# Patient Record
Sex: Female | Born: 1980 | Race: Black or African American | Hispanic: No | Marital: Married | Smoking: Never smoker
Health system: Southern US, Community
[De-identification: ages and names within clinical notes are randomized; demographics above are authoritative.]

## PROBLEM LIST (undated history)

## (undated) ENCOUNTER — Inpatient Hospital Stay (HOSPITAL_COMMUNITY): Payer: Self-pay

## (undated) DIAGNOSIS — K829 Disease of gallbladder, unspecified: Secondary | ICD-10-CM

## (undated) DIAGNOSIS — K219 Gastro-esophageal reflux disease without esophagitis: Secondary | ICD-10-CM

## (undated) DIAGNOSIS — R87619 Unspecified abnormal cytological findings in specimens from cervix uteri: Secondary | ICD-10-CM

## (undated) DIAGNOSIS — R109 Unspecified abdominal pain: Secondary | ICD-10-CM

## (undated) DIAGNOSIS — IMO0002 Reserved for concepts with insufficient information to code with codable children: Secondary | ICD-10-CM

## (undated) DIAGNOSIS — R0602 Shortness of breath: Secondary | ICD-10-CM

## (undated) DIAGNOSIS — D649 Anemia, unspecified: Secondary | ICD-10-CM

## (undated) DIAGNOSIS — E559 Vitamin D deficiency, unspecified: Secondary | ICD-10-CM

## (undated) DIAGNOSIS — K5900 Constipation, unspecified: Secondary | ICD-10-CM

## (undated) DIAGNOSIS — R079 Chest pain, unspecified: Secondary | ICD-10-CM

## (undated) DIAGNOSIS — F32A Depression, unspecified: Secondary | ICD-10-CM

## (undated) DIAGNOSIS — M549 Dorsalgia, unspecified: Secondary | ICD-10-CM

## (undated) HISTORY — DX: Disease of gallbladder, unspecified: K82.9

## (undated) HISTORY — DX: Dorsalgia, unspecified: M54.9

## (undated) HISTORY — DX: Unspecified abnormal cytological findings in specimens from cervix uteri: R87.619

## (undated) HISTORY — DX: Depression, unspecified: F32.A

## (undated) HISTORY — DX: Chest pain, unspecified: R07.9

## (undated) HISTORY — PX: LEEP: SHX91

## (undated) HISTORY — DX: Gastro-esophageal reflux disease without esophagitis: K21.9

## (undated) HISTORY — DX: Reserved for concepts with insufficient information to code with codable children: IMO0002

## (undated) HISTORY — DX: Shortness of breath: R06.02

## (undated) HISTORY — DX: Unspecified abdominal pain: R10.9

## (undated) HISTORY — PX: BREAST BIOPSY: SHX20

## (undated) HISTORY — DX: Constipation, unspecified: K59.00

## (undated) HISTORY — PX: WISDOM TOOTH EXTRACTION: SHX21

## (undated) HISTORY — DX: Anemia, unspecified: D64.9

## (undated) HISTORY — DX: Vitamin D deficiency, unspecified: E55.9

---

## 2002-09-07 ENCOUNTER — Emergency Department (HOSPITAL_COMMUNITY): Admission: EM | Admit: 2002-09-07 | Discharge: 2002-09-07 | Payer: Self-pay | Admitting: Emergency Medicine

## 2002-09-07 ENCOUNTER — Encounter: Payer: Self-pay | Admitting: Emergency Medicine

## 2003-03-31 ENCOUNTER — Inpatient Hospital Stay (HOSPITAL_COMMUNITY): Admission: AD | Admit: 2003-03-31 | Discharge: 2003-03-31 | Payer: Self-pay | Admitting: *Deleted

## 2004-10-28 ENCOUNTER — Other Ambulatory Visit: Admission: RE | Admit: 2004-10-28 | Discharge: 2004-10-28 | Payer: Self-pay | Admitting: Gynecology

## 2005-01-26 ENCOUNTER — Encounter (INDEPENDENT_AMBULATORY_CARE_PROVIDER_SITE_OTHER): Payer: Self-pay | Admitting: Specialist

## 2005-01-26 ENCOUNTER — Ambulatory Visit: Admission: RE | Admit: 2005-01-26 | Discharge: 2005-01-26 | Payer: Self-pay | Admitting: Gynecologic Oncology

## 2005-07-01 ENCOUNTER — Other Ambulatory Visit: Admission: RE | Admit: 2005-07-01 | Discharge: 2005-07-01 | Payer: Self-pay | Admitting: Gynecology

## 2005-12-29 ENCOUNTER — Inpatient Hospital Stay (HOSPITAL_COMMUNITY): Admission: AD | Admit: 2005-12-29 | Discharge: 2005-12-29 | Payer: Self-pay | Admitting: Obstetrics and Gynecology
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
## 2006-02-08 ENCOUNTER — Inpatient Hospital Stay (HOSPITAL_COMMUNITY): Admission: AD | Admit: 2006-02-08 | Discharge: 2006-02-08 | Payer: Self-pay | Admitting: Obstetrics and Gynecology

## 2006-03-05 ENCOUNTER — Inpatient Hospital Stay (HOSPITAL_COMMUNITY): Admission: AD | Admit: 2006-03-05 | Discharge: 2006-03-07 | Payer: Self-pay | Admitting: Obstetrics and Gynecology

## 2006-03-08 ENCOUNTER — Encounter: Admission: RE | Admit: 2006-03-08 | Discharge: 2006-04-06 | Payer: Self-pay | Admitting: Obstetrics and Gynecology

## 2006-04-07 ENCOUNTER — Encounter: Admission: RE | Admit: 2006-04-07 | Discharge: 2006-05-07 | Payer: Self-pay | Admitting: Obstetrics and Gynecology

## 2006-04-19 ENCOUNTER — Other Ambulatory Visit: Admission: RE | Admit: 2006-04-19 | Discharge: 2006-04-19 | Payer: Self-pay | Admitting: Obstetrics and Gynecology

## 2006-05-08 ENCOUNTER — Encounter: Admission: RE | Admit: 2006-05-08 | Discharge: 2006-06-07 | Payer: Self-pay | Admitting: Obstetrics and Gynecology

## 2006-06-08 ENCOUNTER — Encounter: Admission: RE | Admit: 2006-06-08 | Discharge: 2006-07-07 | Payer: Self-pay | Admitting: Obstetrics and Gynecology

## 2006-07-08 ENCOUNTER — Encounter: Admission: RE | Admit: 2006-07-08 | Discharge: 2006-08-07 | Payer: Self-pay | Admitting: Obstetrics and Gynecology

## 2006-08-08 ENCOUNTER — Encounter: Admission: RE | Admit: 2006-08-08 | Discharge: 2006-09-06 | Payer: Self-pay | Admitting: Obstetrics and Gynecology

## 2006-09-07 ENCOUNTER — Encounter: Admission: RE | Admit: 2006-09-07 | Discharge: 2006-10-07 | Payer: Self-pay | Admitting: Obstetrics and Gynecology

## 2006-10-08 ENCOUNTER — Encounter: Admission: RE | Admit: 2006-10-08 | Discharge: 2006-11-07 | Payer: Self-pay | Admitting: Obstetrics and Gynecology

## 2006-10-25 ENCOUNTER — Emergency Department (HOSPITAL_COMMUNITY): Admission: EM | Admit: 2006-10-25 | Discharge: 2006-10-25 | Payer: Self-pay | Admitting: Emergency Medicine
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
## 2006-11-08 ENCOUNTER — Encounter: Admission: RE | Admit: 2006-11-08 | Discharge: 2006-12-06 | Payer: Self-pay | Admitting: Obstetrics and Gynecology

## 2006-12-07 ENCOUNTER — Encounter: Admission: RE | Admit: 2006-12-07 | Discharge: 2007-01-06 | Payer: Self-pay | Admitting: Obstetrics and Gynecology

## 2007-01-07 ENCOUNTER — Encounter: Admission: RE | Admit: 2007-01-07 | Discharge: 2007-02-05 | Payer: Self-pay | Admitting: Obstetrics and Gynecology

## 2007-02-06 ENCOUNTER — Encounter: Admission: RE | Admit: 2007-02-06 | Discharge: 2007-03-08 | Payer: Self-pay | Admitting: Obstetrics and Gynecology

## 2007-03-09 ENCOUNTER — Encounter: Admission: RE | Admit: 2007-03-09 | Discharge: 2007-04-07 | Payer: Self-pay | Admitting: Obstetrics and Gynecology

## 2007-04-08 ENCOUNTER — Encounter: Admission: RE | Admit: 2007-04-08 | Discharge: 2007-05-08 | Payer: Self-pay | Admitting: Obstetrics and Gynecology

## 2007-05-09 ENCOUNTER — Encounter: Admission: RE | Admit: 2007-05-09 | Discharge: 2007-05-31 | Payer: Self-pay | Admitting: Obstetrics and Gynecology

## 2008-01-26 ENCOUNTER — Encounter: Admission: RE | Admit: 2008-01-26 | Discharge: 2008-01-26 | Payer: Self-pay | Admitting: Obstetrics and Gynecology

## 2008-02-07 ENCOUNTER — Encounter: Admission: RE | Admit: 2008-02-07 | Discharge: 2008-02-07 | Payer: Self-pay | Admitting: Obstetrics and Gynecology

## 2008-03-26 ENCOUNTER — Encounter (INDEPENDENT_AMBULATORY_CARE_PROVIDER_SITE_OTHER): Payer: Self-pay | Admitting: Diagnostic Radiology

## 2008-03-26 ENCOUNTER — Encounter: Admission: RE | Admit: 2008-03-26 | Discharge: 2008-03-26 | Payer: Self-pay | Admitting: General Surgery

## 2008-04-30 ENCOUNTER — Encounter: Admission: RE | Admit: 2008-04-30 | Discharge: 2008-04-30 | Payer: Self-pay | Admitting: General Surgery

## 2009-02-19 ENCOUNTER — Encounter: Admission: RE | Admit: 2009-02-19 | Discharge: 2009-02-19 | Payer: Self-pay | Admitting: Obstetrics and Gynecology

## 2011-02-19 NOTE — Consult Note (Signed)
NAME:  [name] [name], [name] [name]              ACCOUNT NO.:  [id number]   MEDICAL RECORD NO.:  [id number]          PATIENT TYPE:  OUT   LOCATION:  GYN                          FACILITY:  [facility]   PHYSICIAN:  Paola A. Duard Brady, MD    DATE OF BIRTH:  08-Oct-1980   DATE OF CONSULTATION:  01/26/2005  DATE OF DISCHARGE:                                   CONSULTATION   REQUESTING PHYSICIAN:  Leatha Gilding. Mezer, M.D.   Ms. [name] [name] is a 30 year old gravida 0 who is referred to [place] secondary to  abnormal Pap smears.  She underwent a LEEP in [place] in June, 2004, which  revealed low-grade dysplasia with negative margins.  She has not had any Pap  smears since that time, and she states that she did not want to know if  there was anything going on.  She was subsequently seen by Dr. Chevis Pretty in  January, 2006, at which time, Pap smear revealed atypical glandular cells  with atypical endocervical cells.  HPV typing was positive.  She  subsequently had difficulties with GYN exam and went to the operating room  on December 10, 2004 for an exam under anesthesia, cervical biopsies, and ECC.  Cervical biopsy at 6 o'clock revealed cervicitis and squamous metaplasia.  The 12 o'clock biopsy revealed low-grade dysplasias.  ECC was negative.  She  comes in today for followup with [place].   Ms. [name] [name] is overall doing quite well.  She is very anxious and nervous  about GYN examinations, and it is difficult for her to tolerate them from a  psychological standpoint.  She denies any irregular bleeding.  She is  sexually active.  She denies any dyspareunia.   PAST MEDICAL HISTORY:  None.   PAST SURGICAL HISTORY:  Right axillary cyst removal.  LEEP.   ALLERGIES:  None.   MEDICATIONS:  NuvaRing, currently using condoms.   SOCIAL HISTORY:  She is a IT consultant for rehab in counseling.  She is  single.  She denies the use of tobacco.  She drinks alcohol occasionally.  She is sexually active.   FAMILY HISTORY:  She has two aunts  with breast cancer.  One a maternal aunt  and one on the paternal side.  She has one maternal aunt who is alive with  breast cancer.  There is no colon or ovarian cancer.   PHYSICAL EXAMINATION:  VITAL SIGNS:  Height 5 foot 2.  Weight 165 pounds.  Blood pressure 118/66.  Pulse 84, respirations 18.  GENERAL:  Well-developed, well-nourished, pleasant female in no acute  distress.  PELVIC:  External genitalia is within normal limits.  The vagina is well  epithelialized.  The cervix is significantly foreshortened.  The exam is  poorly tolerated by the patient, in that she is not able to cooperate by  lying still.  She denies any pain associated with the examination.  There  are no gross visible lesions.  Colposcopy was performed.  It is adequate  with the use of a Q-tip when the patient is able to cooperate.  ThinPrep Pap  was submitted without difficulty.  Bimanual  examination reveals no cervical  motion tenderness.  The cervix is significantly foreshortened, measuring  approximately 1.5 cm to 2 cm.  The corpus is of normal size, shape, and  consistency.  There are no adnexal masses.   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   PLAN:  Follow up on the results of her Pap smear from today.  Should today's  Pap smear reveal more than low-grade dysplasia, she can return to see [place] in  four months for repeat.      PAG/MEDQ  D:  01/26/2005  T:  01/26/2005  Job:  [id number]   cc:   Leatha Gilding. Mezer, M.D.  [address]  [place]  [place] [address]  Fax: [phone]   Telford Nab, R.N.  [phone] [address]  [place], [place] [address]

## 2011-02-19 NOTE — Discharge Summary (Signed)
NAME:  [name] [name], [name] [name]              ACCOUNT NO.:  [id number]   MEDICAL RECORD NO.:  [id number]          PATIENT TYPE:  INP   LOCATION:  9105                          [facility]ACILITY:  [facility]   PHYSICIAN:  Hal Morales, M.D.DATE OF BIRTH:  [birth year]   DATE OF ADMISSION:  03/05/2006  DATE OF DISCHARGE:  03/07/2006                                 DISCHARGE SUMMARY   ADMITTING DIAGNOSIS:  1.  Intrauterine pregnancy at 40-5/7 weeks.  2.  Early labor.  3.  Positive group B Streptococcus.   PROCEDURE:  1.  Vacuum-assisted vaginal delivery.  2.  Repair of first-degree perineal laceration.   DISCHARGE DIAGNOSES:  1.  Intrauterine pregnancy at term, delivered.  2.  Vacuum-assisted vaginal delivery.  3.  Repair of first-degree perineal laceration.  4.  Positive group B Streptococcus.   [name] is a 30 year old, gravida 1, para 0, who presented at term in  early labor.  Her labor progressed normally.  However, she began to have  fetal heart rate decelerations. Fetal heart rate recovered to baseline with  maneuvers, and labor did continue with pushing.  Fetal heart rate dropped to  60 beats per minute, and vacuum was applied with consent by Dr. Dois Davenport  Rivard.  See dictated note. The patient had a vacuum-assisted vaginal  delivery and gave birth to a 6 pound 6 ounce female infant named Lenell Antu with Apgar scores of 7 at 1 minute and 9 at 5 minutes. Infant and  patient have done well in the postpartum period.  Baby is breast feeding and  ready for discharge on this, the second postpartum day. The patient's vital  signs have remained stable.  She is afebrile.  Her hemoglobin on the first  postpartum day was 11.2, and on this, her second postpartum day, she is  judged to be in satisfactory condition for discharge.  Discharge  instructions per [facility]  handout.   DISCHARGE MEDICATIONS:  1.  Motrin 600 mg p.o. q.6 h p.r.n. pain.  2.  Tylox 1 to 2 p.o. q. 3-4 h  p.r.n. pain.  3.  Prenatal vitamins.   Discharge follow-up will be at the office of [facility] in  6 weeks.      Rica Koyanagi, C.N.M.      Hal Morales, M.D.  Electronically Signed    SDM/MEDQ  D:  03/07/2006  T:  03/07/2006  Job:  [id number]
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
## 2012-07-12 DIAGNOSIS — Z1371 Encounter for nonprocreative screening for genetic disease carrier status: Secondary | ICD-10-CM

## 2012-07-12 DIAGNOSIS — Z803 Family history of malignant neoplasm of breast: Secondary | ICD-10-CM | POA: Insufficient documentation

## 2012-07-12 DIAGNOSIS — IMO0002 Reserved for concepts with insufficient information to code with codable children: Secondary | ICD-10-CM | POA: Insufficient documentation

## 2012-07-18 ENCOUNTER — Encounter: Payer: Self-pay | Admitting: Obstetrics and Gynecology

## 2012-07-18 ENCOUNTER — Ambulatory Visit (INDEPENDENT_AMBULATORY_CARE_PROVIDER_SITE_OTHER): Payer: BC Managed Care – PPO | Admitting: Obstetrics and Gynecology

## 2012-07-18 VITALS — BP 110/68 | Wt 176.6 lb

## 2012-07-18 DIAGNOSIS — Z309 Encounter for contraceptive management, unspecified: Secondary | ICD-10-CM

## 2012-07-18 MED ORDER — NORETHINDRONE ACET-ETHINYL EST 1-20 MG-MCG PO TABS: 1.0000 | ORAL_TABLET | Freq: Every day | ORAL | Status: DC

## 2012-07-18 NOTE — Progress Notes (Signed)
Subjective:    [name] [name] is a 31 y.o. female, G1P1, who presents for to discuss b/c .Loestrin 24: was happy until was changed to Minestrin. Cycle has changed with heavier flow. Feels moodier and tired.  The following portions of the patient's history were reviewed and updated as appropriate: allergies, current medications, past family history.  Review of Systems Pertinent items are noted in HPI. Breast:Negative for breast lump,nipple discharge or nipple retraction Gastrointestinal: Negative for abdominal pain, change in bowel habits or rectal bleeding Urinary:negative   Objective:    BP 110/68  Wt 176 lb 9.6 oz (80.105 kg)    Weight:  Wt Readings from Last 1 Encounters:  07/18/12 176 lb 9.6 oz (80.105 kg)          BMI: There is no height on file to calculate BMI.  General Appearance: Alert, appropriate appearance for age. No acute distress    Assessment:    Contraceptive management    Plan:   The following methods were reviewed:  Combined oral contraceptives  yes    With expected benefits of: cycle control, reduction in menstrual flow and dysmenorrhea, improvement of PMS and reduction of ovarian cysts. Risks of DVT/PE discussed.   Nuvaring yes With expected benefits of: cycle control, reduction in menstrual flow and dysmenorrhea, improvement of PMS and reduction of ovarian cysts. Risks of DVT/PE discussed.Benefit of monthly use as opposed to daily use was also reviewed.   Mirena yes With expected benefits of: lack of estrogen,5 year duration, high reliability at 99.9%, reversibility, reduction or cessation of menstrual flow and improvement or resolution of dysmenorrhea/pelvic pain. Risks at the time of insertion were reviewed: dysfunctional uterine bleeding lasting up to 6 months, infection and uterine perforation which may require laparoscopy for retrieval.  Face-to-face encounter time: 25 minutes.  Plan: starting loestrin 21 and will add 3 pills every month  AEX  11/2012   Hakeem Frazzini A MD 07/18/2012 12:26 PM    Silverio Lay MD

## 2012-12-07 ENCOUNTER — Encounter: Payer: Self-pay | Admitting: Obstetrics and Gynecology

## 2012-12-07 ENCOUNTER — Ambulatory Visit: Payer: Self-pay | Admitting: Obstetrics and Gynecology

## 2012-12-07 VITALS — BP 116/68 | Ht 61.5 in | Wt 171.0 lb

## 2012-12-07 DIAGNOSIS — Z01419 Encounter for gynecological examination (general) (routine) without abnormal findings: Secondary | ICD-10-CM

## 2012-12-07 DIAGNOSIS — Z8742 Personal history of other diseases of the female genital tract: Secondary | ICD-10-CM | POA: Insufficient documentation

## 2012-12-07 MED ORDER — NORETHINDRONE ACET-ETHINYL EST 1-20 MG-MCG PO TABS: 1.0000 | ORAL_TABLET | Freq: Every day | ORAL | Status: DC
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
## 2012-12-08 LAB — PAP IG W/ RFLX HPV ASCU

## 2013-05-06 ENCOUNTER — Ambulatory Visit (INDEPENDENT_AMBULATORY_CARE_PROVIDER_SITE_OTHER): Payer: 59 | Admitting: Internal Medicine

## 2013-05-06 VITALS — BP 118/62 | HR 76 | Temp 98.1°F | Resp 16 | Ht 63.0 in | Wt 174.0 lb

## 2013-05-06 DIAGNOSIS — Z0000 Encounter for general adult medical examination without abnormal findings: Secondary | ICD-10-CM

## 2013-05-06 DIAGNOSIS — Z111 Encounter for screening for respiratory tuberculosis: Secondary | ICD-10-CM

## 2013-05-06 DIAGNOSIS — Z23 Encounter for immunization: Secondary | ICD-10-CM

## 2013-05-06 NOTE — Patient Instructions (Addendum)

## 2013-05-06 NOTE — Progress Notes (Signed)
32 YO female here today for a personal physical with a form for [employer]. Patient states she is in good health. She has not had a Tetanus shot since 1999.  Has strong fhx breast cancer, gyn follows this. Has great fear of needles. 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 Physical exam Normal head to toe/Healthy exam/Has lab work with gyn yearly Plan Tdap/TB screen/RTC 48-72 hrs for ppd reading   Tuberculosis Risk Questionnaire  1. Were you born outside the [place] in one of the following parts of the world: [place], [place], [place], [place] or [place]?  No  2. Have you traveled outside the [place] and lived for more than one month in one of the following parts of the world: [place], [place], [place], [place] or [place]?  No  3. Do you have a compromised immune system such as from any of the following conditions:HIV/AIDS, organ or bone marrow transplantation, diabetes, immunosuppressive medicines (e.g. Prednisone, Remicaide), leukemia, lymphoma, cancer of the head or neck, gastrectomy or jejunal bypass, end-stage renal disease (on dialysis), or silicosis?  No   4. Have you ever or do you plan on working in: a residential care center, a health care facility, a jail or prison or homeless shelter?  No  5. Have you ever: injected illegal drugs, used crack cocaine, lived in a homeless shelter  or been in jail or prison?   No  6. Have you ever been exposed to anyone with infectious tuberculosis?  No   Tuberculosis Symptom Questionnaire  Do you currently have any of the following symptoms?  1. Unexplained cough lasting more than 3 weeks? No  2. Unexplained fever lasting more than 3 weeks. No   3. Night Sweats (sweating that leaves the bedclothes and sheets wet)   No  4. Shortness of Breath No  5. Chest Pain No  6. Unintentional weight loss  No  7. Unexplained fatigue (very tired for no reason) yes

## 2013-05-08 ENCOUNTER — Ambulatory Visit (INDEPENDENT_AMBULATORY_CARE_PROVIDER_SITE_OTHER): Payer: 59 | Admitting: *Deleted

## 2013-05-08 DIAGNOSIS — Z111 Encounter for screening for respiratory tuberculosis: Secondary | ICD-10-CM

## 2014-01-21 ENCOUNTER — Other Ambulatory Visit: Payer: Self-pay

## 2014-04-13 ENCOUNTER — Emergency Department (HOSPITAL_COMMUNITY)
Admission: EM | Admit: 2014-04-13 | Discharge: 2014-04-13 | Disposition: A | Discharge status: Home / Self Care | Payer: 59 | Attending: Emergency Medicine | Admitting: Emergency Medicine

## 2014-04-13 ENCOUNTER — Encounter (HOSPITAL_COMMUNITY): Payer: Self-pay | Admitting: Emergency Medicine

## 2014-04-13 DIAGNOSIS — N390 Urinary tract infection, site not specified: Secondary | ICD-10-CM

## 2014-04-13 DIAGNOSIS — Z87440 Personal history of urinary (tract) infections: Secondary | ICD-10-CM | POA: Insufficient documentation

## 2014-04-13 DIAGNOSIS — R319 Hematuria, unspecified: Secondary | ICD-10-CM | POA: Insufficient documentation

## 2014-04-13 DIAGNOSIS — Z3202 Encounter for pregnancy test, result negative: Secondary | ICD-10-CM | POA: Insufficient documentation

## 2014-04-13 DIAGNOSIS — R3915 Urgency of urination: Secondary | ICD-10-CM | POA: Insufficient documentation

## 2014-04-13 LAB — URINALYSIS, ROUTINE W REFLEX MICROSCOPIC
Glucose, UA: NEGATIVE mg/dL
Ketones, ur: NEGATIVE mg/dL
NITRITE: POSITIVE — AB
PH: 6 (ref 5.0–8.0)
Protein, ur: NEGATIVE mg/dL
SPECIFIC GRAVITY, URINE: 1.03 (ref 1.005–1.030)
UROBILINOGEN UA: 1 mg/dL (ref 0.0–1.0)

## 2014-04-13 LAB — URINE MICROSCOPIC-ADD ON

## 2014-04-13 LAB — PREGNANCY, URINE: Preg Test, Ur: NEGATIVE

## 2014-04-13 MED ORDER — CIPROFLOXACIN HCL 500 MG PO TABS: 500.0000 mg | ORAL_TABLET | Freq: Two times a day (BID) | ORAL | Status: DC

## 2014-04-13 MED ORDER — CIPROFLOXACIN HCL 500 MG PO TABS
500.0000 mg | ORAL_TABLET | Freq: Once | ORAL | Status: AC
  Administered 2014-04-13: 500 mg via ORAL
  Filled 2014-04-13: qty 1
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
## 2014-04-13 NOTE — Discharge Instructions (Signed)
Take cipro for a week.   Stay hydrated.   Follow up with your doctor.   Return to ER if you have fever, vomiting, worse back pain.

## 2014-04-13 NOTE — ED Notes (Addendum)
Pt states that she started having what she believes to be blood in her urine. States that it has been orange since yesterday after taking Azo tablets.  Pt started taking them because she didn't feel like she was peeing enough.  Denies dysuria.

## 2014-04-15 LAB — URINE CULTURE: Colony Count: >=100000

## 2014-08-05 ENCOUNTER — Encounter (HOSPITAL_COMMUNITY): Payer: Self-pay | Admitting: Emergency Medicine

## 2015-10-05 NOTE — L&D Delivery Note (Signed)
Delivery Note At 5:29 PM a viable female was delivered via Vaginal, Spontaneous Delivery (Presentation: ;  ).  APGAR: 8, 9; weight 8 lb 2.5 oz (3700 g).   Placenta status:intact.  Cord: 3V with the following complications: .  Cord pH: n/a  Anesthesia:  Epidural Episiotomy: None Lacerations: 1st degree;Vaginal Suture Repair: 3.0 vicryl Est. Blood Loss (mL): 300  Mom to postpartum.  Baby to Couplet care / Skin to Skin.  Purcell NailsROBERTS,Lamya Lausch Y 05/28/2016, 5:53 PM

## 2015-10-07 LAB — OB RESULTS CONSOLE HEPATITIS B SURFACE ANTIGEN: HEP B S AG: NEGATIVE

## 2015-10-07 LAB — OB RESULTS CONSOLE GC/CHLAMYDIA
Chlamydia: NEGATIVE
Gonorrhea: NEGATIVE

## 2015-10-07 LAB — OB RESULTS CONSOLE ABO/RH: RH TYPE: POSITIVE

## 2015-10-07 LAB — OB RESULTS CONSOLE HIV ANTIBODY (ROUTINE TESTING): HIV: NONREACTIVE

## 2015-10-07 LAB — OB RESULTS CONSOLE RPR: RPR: NONREACTIVE

## 2015-10-07 LAB — OB RESULTS CONSOLE RUBELLA ANTIBODY, IGM: Rubella: IMMUNE

## 2015-10-07 LAB — OB RESULTS CONSOLE ANTIBODY SCREEN: ANTIBODY SCREEN: NEGATIVE

## 2015-12-25 ENCOUNTER — Emergency Department (HOSPITAL_COMMUNITY)
Admission: EM | Admit: 2015-12-25 | Discharge: 2015-12-25 | Disposition: A | Discharge status: Home / Self Care | Payer: 59 | Attending: Emergency Medicine | Admitting: Emergency Medicine

## 2015-12-25 ENCOUNTER — Encounter (HOSPITAL_COMMUNITY): Payer: Self-pay | Admitting: *Deleted

## 2015-12-25 DIAGNOSIS — R05 Cough: Secondary | ICD-10-CM | POA: Diagnosis not present

## 2015-12-25 DIAGNOSIS — O9989 Other specified diseases and conditions complicating pregnancy, childbirth and the puerperium: Secondary | ICD-10-CM | POA: Insufficient documentation

## 2015-12-25 DIAGNOSIS — Z79899 Other long term (current) drug therapy: Secondary | ICD-10-CM | POA: Insufficient documentation

## 2015-12-25 DIAGNOSIS — R112 Nausea with vomiting, unspecified: Secondary | ICD-10-CM

## 2015-12-25 DIAGNOSIS — Z3A18 18 weeks gestation of pregnancy: Secondary | ICD-10-CM | POA: Diagnosis not present

## 2015-12-25 DIAGNOSIS — O219 Vomiting of pregnancy, unspecified: Secondary | ICD-10-CM | POA: Insufficient documentation

## 2015-12-25 DIAGNOSIS — R059 Cough, unspecified: Secondary | ICD-10-CM

## 2015-12-25 LAB — CBC
HEMATOCRIT: 33.9 % — AB (ref 36.0–46.0)
HEMOGLOBIN: 11.3 g/dL — AB (ref 12.0–15.0)
MCH: 30 pg (ref 26.0–34.0)
MCHC: 33.3 g/dL (ref 30.0–36.0)
MCV: 89.9 fL (ref 78.0–100.0)
Platelets: 283 10*3/uL (ref 150–400)
RBC: 3.77 MIL/uL — ABNORMAL LOW (ref 3.87–5.11)
RDW: 13.5 % (ref 11.5–15.5)
WBC: 8.2 10*3/uL (ref 4.0–10.5)

## 2015-12-25 LAB — INFLUENZA PANEL BY PCR (TYPE A & B)
H1N1 flu by pcr: NOT DETECTED
INFLBPCR: NEGATIVE
Influenza A By PCR: NEGATIVE

## 2015-12-25 LAB — COMPREHENSIVE METABOLIC PANEL
ALBUMIN: 2.8 g/dL — AB (ref 3.5–5.0)
ALT: 41 U/L (ref 14–54)
ANION GAP: 10 (ref 5–15)
AST: 34 U/L (ref 15–41)
Alkaline Phosphatase: 62 U/L (ref 38–126)
CHLORIDE: 106 mmol/L (ref 101–111)
CO2: 22 mmol/L (ref 22–32)
Calcium: 9.3 mg/dL (ref 8.9–10.3)
Creatinine, Ser: 0.65 mg/dL (ref 0.44–1.00)
GFR calc Af Amer: >60 mL/min (ref >60)
GFR calc non Af Amer: >60 mL/min (ref >60)
GLUCOSE: 92 mg/dL (ref 65–99)
POTASSIUM: 3.8 mmol/L (ref 3.5–5.1)
SODIUM: 138 mmol/L (ref 135–145)
TOTAL PROTEIN: 6.5 g/dL (ref 6.5–8.1)
Total Bilirubin: 0.3 mg/dL (ref 0.3–1.2)

## 2015-12-25 LAB — LIPASE, BLOOD: LIPASE: 27 U/L (ref 11–51)

## 2015-12-25 MED ORDER — SODIUM CHLORIDE 0.9 % IV BOLUS (SEPSIS): 1000.0000 mL | Freq: Once | INTRAVENOUS | Status: DC

## 2015-12-25 MED ORDER — OSELTAMIVIR PHOSPHATE 75 MG PO CAPS: 75.0000 mg | ORAL_CAPSULE | Freq: Two times a day (BID) | ORAL | Status: DC

## 2015-12-25 MED ORDER — ALBUTEROL SULFATE HFA 108 (90 BASE) MCG/ACT IN AERS
2.0000 | INHALATION_SPRAY | Freq: Once | RESPIRATORY_TRACT | Status: AC
  Administered 2015-12-25: 2 via RESPIRATORY_TRACT
  Filled 2015-12-25: qty 6.7

## 2015-12-25 MED ORDER — ONDANSETRON 4 MG PO TBDP: ORAL_TABLET | ORAL | Status: DC

## 2015-12-25 NOTE — ED Notes (Signed)
Requested urine sample from pt ? ?

## 2015-12-25 NOTE — ED Notes (Signed)
MD at bedside. 

## 2015-12-25 NOTE — ED Notes (Signed)
C/o cough congestion c/o vomiting onset tues. States she was seen by [facility] on tues. Staes she is 18 weeks preg
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
## 2016-03-04 ENCOUNTER — Inpatient Hospital Stay (HOSPITAL_COMMUNITY)
Admission: AD | Admit: 2016-03-04 | Discharge: 2016-03-04 | Disposition: A | Discharge status: Home / Self Care | Payer: 59 | Source: Ambulatory Visit | Attending: Obstetrics & Gynecology | Admitting: Obstetrics & Gynecology

## 2016-03-04 ENCOUNTER — Encounter (HOSPITAL_COMMUNITY): Payer: Self-pay | Admitting: *Deleted

## 2016-03-04 DIAGNOSIS — Z3A28 28 weeks gestation of pregnancy: Secondary | ICD-10-CM | POA: Diagnosis not present

## 2016-03-04 DIAGNOSIS — R000 Tachycardia, unspecified: Secondary | ICD-10-CM | POA: Insufficient documentation

## 2016-03-04 DIAGNOSIS — O99613 Diseases of the digestive system complicating pregnancy, third trimester: Secondary | ICD-10-CM | POA: Insufficient documentation

## 2016-03-04 DIAGNOSIS — A084 Viral intestinal infection, unspecified: Secondary | ICD-10-CM | POA: Diagnosis not present

## 2016-03-04 DIAGNOSIS — J069 Acute upper respiratory infection, unspecified: Secondary | ICD-10-CM | POA: Diagnosis not present

## 2016-03-04 DIAGNOSIS — Z888 Allergy status to other drugs, medicaments and biological substances status: Secondary | ICD-10-CM | POA: Diagnosis not present

## 2016-03-04 DIAGNOSIS — O99513 Diseases of the respiratory system complicating pregnancy, third trimester: Secondary | ICD-10-CM | POA: Diagnosis not present

## 2016-03-04 DIAGNOSIS — Z823 Family history of stroke: Secondary | ICD-10-CM | POA: Diagnosis not present

## 2016-03-04 DIAGNOSIS — O09523 Supervision of elderly multigravida, third trimester: Secondary | ICD-10-CM | POA: Diagnosis not present

## 2016-03-04 DIAGNOSIS — K529 Noninfective gastroenteritis and colitis, unspecified: Secondary | ICD-10-CM

## 2016-03-04 DIAGNOSIS — O26893 Other specified pregnancy related conditions, third trimester: Secondary | ICD-10-CM | POA: Insufficient documentation

## 2016-03-04 DIAGNOSIS — Z8249 Family history of ischemic heart disease and other diseases of the circulatory system: Secondary | ICD-10-CM | POA: Diagnosis not present

## 2016-03-04 LAB — AMYLASE: Amylase: 120 U/L — ABNORMAL HIGH (ref 28–100)

## 2016-03-04 LAB — URINALYSIS, ROUTINE W REFLEX MICROSCOPIC
Bilirubin Urine: NEGATIVE
Glucose, UA: NEGATIVE mg/dL
KETONES UR: NEGATIVE mg/dL
LEUKOCYTES UA: NEGATIVE
NITRITE: NEGATIVE
PH: 5.5 (ref 5.0–8.0)
PROTEIN: NEGATIVE mg/dL
Specific Gravity, Urine: 1.02 (ref 1.005–1.030)

## 2016-03-04 LAB — CBC
HCT: 31.7 % — ABNORMAL LOW (ref 36.0–46.0)
HEMOGLOBIN: 10.9 g/dL — AB (ref 12.0–15.0)
MCH: 30.8 pg (ref 26.0–34.0)
MCHC: 34.4 g/dL (ref 30.0–36.0)
MCV: 89.5 fL (ref 78.0–100.0)
Platelets: 275 10*3/uL (ref 150–400)
RBC: 3.54 MIL/uL — AB (ref 3.87–5.11)
RDW: 14.1 % (ref 11.5–15.5)
WBC: 10.3 10*3/uL (ref 4.0–10.5)

## 2016-03-04 LAB — COMPREHENSIVE METABOLIC PANEL
ALK PHOS: 109 U/L (ref 38–126)
ALT: 19 U/L (ref 14–54)
AST: 28 U/L (ref 15–41)
Albumin: 3.2 g/dL — ABNORMAL LOW (ref 3.5–5.0)
Anion gap: 7 (ref 5–15)
BUN: <5 mg/dL — ABNORMAL LOW (ref 6–20)
CALCIUM: 9.6 mg/dL (ref 8.9–10.3)
CO2: 24 mmol/L (ref 22–32)
CREATININE: 0.44 mg/dL (ref 0.44–1.00)
Chloride: 104 mmol/L (ref 101–111)
GFR calc Af Amer: >60 mL/min (ref >60)
GFR calc non Af Amer: >60 mL/min (ref >60)
GLUCOSE: 88 mg/dL (ref 65–99)
Potassium: 3.3 mmol/L — ABNORMAL LOW (ref 3.5–5.1)
SODIUM: 135 mmol/L (ref 135–145)
Total Bilirubin: 0.5 mg/dL (ref 0.3–1.2)
Total Protein: 7 g/dL (ref 6.5–8.1)

## 2016-03-04 LAB — URINE MICROSCOPIC-ADD ON
BACTERIA UA: NONE SEEN
WBC, UA: NONE SEEN WBC/hpf (ref 0–5)

## 2016-03-04 LAB — LIPASE, BLOOD: Lipase: 20 U/L (ref 11–51)

## 2016-03-04 MED ORDER — LACTATED RINGERS IV SOLN: INTRAVENOUS | Status: DC

## 2016-03-04 MED ORDER — GUAIFENESIN ER 600 MG PO TB12: 600.0000 mg | ORAL_TABLET | Freq: Two times a day (BID) | ORAL | Status: DC

## 2016-03-04 MED ORDER — SODIUM CHLORIDE 0.9 % IV SOLN
8.0000 mg | Freq: Once | INTRAVENOUS | Status: AC
  Administered 2016-03-04: 8 mg via INTRAVENOUS
  Filled 2016-03-04: qty 4

## 2016-03-04 MED ORDER — LACTATED RINGERS IV BOLUS (SEPSIS)
500.0000 mL | Freq: Once | INTRAVENOUS | Status: AC
  Administered 2016-03-04: 500 mL via INTRAVENOUS

## 2016-03-04 MED ORDER — GUAIFENESIN ER 600 MG PO TB12
600.0000 mg | ORAL_TABLET | Freq: Two times a day (BID) | ORAL | Status: DC
  Administered 2016-03-04: 600 mg via ORAL
  Filled 2016-03-04 (×2): qty 1

## 2016-03-04 NOTE — Discharge Instructions (Signed)
Viral Gastroenteritis Viral gastroenteritis is also called stomach flu. This illness is caused by a certain type of germ (virus). It can cause sudden watery poop (diarrhea) and throwing up (vomiting). This can cause you to lose body fluids (dehydration). This illness usually lasts for 3 to 8 days. It usually goes away on its own. HOME CARE   Drink enough fluids to keep your pee (urine) clear or pale yellow. Drink small amounts of fluids often.  Ask your doctor how to replace body fluid losses (rehydration).  Avoid:  Foods high in sugar.  Alcohol.  Bubbly (carbonated) drinks.  Tobacco.  Juice.  Caffeine drinks.  Very hot or cold fluids.  Fatty, greasy foods.  Eating too much at one time.  Dairy products until 24 to 48 hours after your watery poop stops.  You may eat foods with active cultures (probiotics). They can be found in some yogurts and supplements.  Wash your hands well to avoid spreading the illness.  Only take medicines as told by your doctor. Do not give aspirin to children. Do not take medicines for watery poop (antidiarrheals).  Ask your doctor if you should keep taking your regular medicines.  Keep all doctor visits as told. GET HELP RIGHT AWAY IF:   You cannot keep fluids down.  You do not pee at least once every 6 to 8 hours.  You are short of breath.  You see blood in your poop or throw up. This may look like coffee grounds.  You have belly (abdominal) pain that gets worse or is just in one small spot (localized).  You keep throwing up or having watery poop.  You have a fever.  The patient is a child younger than 3 months, and he or she has a fever.  The patient is a child older than 3 months, and he or she has a fever and problems that do not go away.  The patient is a child older than 3 months, and he or she has a fever and problems that suddenly get worse.  The patient is a baby, and he or she has no tears when crying. MAKE SURE YOU:     Understand these instructions.  Will watch your condition.  Will get help right away if you are not doing well or get worse.   This information is not intended to replace advice given to you by your health care provider. Make sure you discuss any questions you have with your health care provider.   Document Released: 03/08/2008 Document Revised: 12/13/2011 Document Reviewed: 07/07/2011 Elsevier Interactive Patient Education 2016 Elsevier Inc[employer] Upper Respiratory Infection, Adult Most upper respiratory infections (URIs) are a viral infection of the air passages leading to the lungs. A URI affects the nose, throat, and upper air passages. The most common type of URI is nasopharyngitis and is typically referred to as "the common cold." URIs run their course and usually go away on their own. Most of the time, a URI does not require medical attention, but sometimes a bacterial infection in the upper airways can follow a viral infection. This is called a secondary infection. Sinus and middle ear infections are common types of secondary upper respiratory infections. Bacterial pneumonia can also complicate a URI. A URI can worsen asthma and chronic obstructive pulmonary disease (COPD). Sometimes, these complications can require emergency medical care and may be life threatening.  CAUSES Almost all URIs are caused by viruses. A virus is a type of germ and can spread from one  person to another.  RISKS FACTORS You may be at risk for a URI if:   You smoke.   You have chronic heart or lung disease.  You have a weakened defense (immune) system.   You are very young or very old.   You have nasal allergies or asthma.  You work in crowded or poorly ventilated areas.  You work in health care facilities or schools. SIGNS AND SYMPTOMS  Symptoms typically develop 2-3 days after you come in contact with a cold virus. Most viral URIs last 7-10 days. However, viral URIs from the influenza virus (flu  virus) can last 14-18 days and are typically more severe. Symptoms may include:   Runny or stuffy (congested) nose.   Sneezing.   Cough.   Sore throat.   Headache.   Fatigue.   Fever.   Loss of appetite.   Pain in your forehead, behind your eyes, and over your cheekbones (sinus pain).  Muscle aches.  DIAGNOSIS  Your health care provider may diagnose a URI by:  Physical exam.  Tests to check that your symptoms are not due to another condition such as:  Strep throat.  Sinusitis.  Pneumonia.  Asthma. TREATMENT  A URI goes away on its own with time. It cannot be cured with medicines, but medicines may be prescribed or recommended to relieve symptoms. Medicines may help:  Reduce your fever.  Reduce your cough.  Relieve nasal congestion. HOME CARE INSTRUCTIONS   Take medicines only as directed by your health care provider.   Gargle warm saltwater or take cough drops to comfort your throat as directed by your health care provider.  Use a warm mist humidifier or inhale steam from a shower to increase air moisture. This may make it easier to breathe.  Drink enough fluid to keep your urine clear or pale yellow.   Eat soups and other clear broths and maintain good nutrition.   Rest as needed.   Return to work when your temperature has returned to normal or as your health care provider advises. You may need to stay home longer to avoid infecting others. You can also use a face mask and careful hand washing to prevent spread of the virus.  Increase the usage of your inhaler if you have asthma.   Do not use any tobacco products, including cigarettes, chewing tobacco, or electronic cigarettes. If you need help quitting, ask your health care provider. PREVENTION  The best way to protect yourself from getting a cold is to practice good hygiene.   Avoid oral or hand contact with people with cold symptoms.   Wash your hands often if contact occurs.   There is no clear evidence that vitamin C, vitamin E, echinacea, or exercise reduces the chance of developing a cold. However, it is always recommended to get plenty of rest, exercise, and practice good nutrition.  SEEK MEDICAL CARE IF:   You are getting worse rather than better.   Your symptoms are not controlled by medicine.   You have chills.  You have worsening shortness of breath.  You have brown or red mucus.  You have yellow or brown nasal discharge.  You have pain in your face, especially when you bend forward.  You have a fever.  You have swollen neck glands.  You have pain while swallowing.  You have white areas in the back of your throat. SEEK IMMEDIATE MEDICAL CARE IF:   You have severe or persistent:  Headache.  Ear pain.  Sinus pain. °¨ Chest pain. °· You have chronic lung disease and any of the following: °¨ Wheezing. °¨ Prolonged cough. °¨ Coughing up blood. °¨ A change in your usual mucus. °· You have a stiff neck. °· You have changes in your: °¨ Vision. °¨ Hearing. °¨ Thinking. °¨ Mood. °MAKE SURE YOU:  °· Understand these instructions. °· Will watch your condition. °· Will get help right away if you are not doing well or get worse. °  °This information is not intended to replace advice given to you by your health care provider. Make sure you discuss any questions you have with your health care provider. °  °Document Released: 03/16/2001 Document Revised: 02/04/2015 Document Reviewed: 12/26/2013 °Elsevier Interactive Patient Education ©2016 Elsevier Inc. ° °

## 2016-03-04 NOTE — MAU Note (Signed)
Pt stated she went to her appointment today and they told her her HR was up and she might be diarrhea and vomiting since Tuesday. C/o also of cough.
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
## 2016-03-20 ENCOUNTER — Encounter (HOSPITAL_COMMUNITY): Payer: Self-pay | Admitting: *Deleted

## 2016-03-20 ENCOUNTER — Inpatient Hospital Stay (HOSPITAL_COMMUNITY)
Admission: AD | Admit: 2016-03-20 | Discharge: 2016-03-20 | Disposition: A | Discharge status: Home / Self Care | Payer: 59 | Source: Ambulatory Visit | Attending: Obstetrics and Gynecology | Admitting: Obstetrics and Gynecology

## 2016-03-20 DIAGNOSIS — O26893 Other specified pregnancy related conditions, third trimester: Secondary | ICD-10-CM | POA: Diagnosis not present

## 2016-03-20 DIAGNOSIS — Z3A30 30 weeks gestation of pregnancy: Secondary | ICD-10-CM | POA: Insufficient documentation

## 2016-03-20 DIAGNOSIS — R1011 Right upper quadrant pain: Secondary | ICD-10-CM | POA: Diagnosis not present

## 2016-03-20 LAB — CBC WITH DIFFERENTIAL/PLATELET
Basophils Absolute: 0 10*3/uL (ref 0.0–0.1)
Basophils Relative: 0 %
EOS ABS: 0.1 10*3/uL (ref 0.0–0.7)
Eosinophils Relative: 1 %
HCT: 32.3 % — ABNORMAL LOW (ref 36.0–46.0)
HEMOGLOBIN: 11 g/dL — AB (ref 12.0–15.0)
LYMPHS ABS: 3.1 10*3/uL (ref 0.7–4.0)
Lymphocytes Relative: 34 %
MCH: 30.8 pg (ref 26.0–34.0)
MCHC: 34.1 g/dL (ref 30.0–36.0)
MCV: 90.5 fL (ref 78.0–100.0)
MONO ABS: 0.5 10*3/uL (ref 0.1–1.0)
MONOS PCT: 6 %
NEUTROS PCT: 59 %
Neutro Abs: 5.3 10*3/uL (ref 1.7–7.7)
Platelets: 279 10*3/uL (ref 150–400)
RBC: 3.57 MIL/uL — ABNORMAL LOW (ref 3.87–5.11)
RDW: 14.3 % (ref 11.5–15.5)
WBC: 9 10*3/uL (ref 4.0–10.5)

## 2016-03-20 LAB — COMPREHENSIVE METABOLIC PANEL
ALBUMIN: 2.9 g/dL — AB (ref 3.5–5.0)
ALK PHOS: 131 U/L — AB (ref 38–126)
ALT: 16 U/L (ref 14–54)
AST: 23 U/L (ref 15–41)
Anion gap: 8 (ref 5–15)
CALCIUM: 9.4 mg/dL (ref 8.9–10.3)
CHLORIDE: 106 mmol/L (ref 101–111)
CO2: 22 mmol/L (ref 22–32)
CREATININE: 0.49 mg/dL (ref 0.44–1.00)
GFR calc non Af Amer: >60 mL/min (ref >60)
GLUCOSE: 74 mg/dL (ref 65–99)
Potassium: 3.7 mmol/L (ref 3.5–5.1)
SODIUM: 136 mmol/L (ref 135–145)
Total Bilirubin: 0.7 mg/dL (ref 0.3–1.2)
Total Protein: 6.4 g/dL — ABNORMAL LOW (ref 6.5–8.1)

## 2016-03-20 LAB — URINALYSIS, ROUTINE W REFLEX MICROSCOPIC
Bilirubin Urine: NEGATIVE
Glucose, UA: NEGATIVE mg/dL
Hgb urine dipstick: NEGATIVE
Ketones, ur: NEGATIVE mg/dL
LEUKOCYTES UA: NEGATIVE
NITRITE: NEGATIVE
PROTEIN: NEGATIVE mg/dL
SPECIFIC GRAVITY, URINE: 1.02 (ref 1.005–1.030)
pH: 5.5 (ref 5.0–8.0)

## 2016-03-20 MED ORDER — PROMETHAZINE HCL 12.5 MG PO TABS: 12.5000 mg | ORAL_TABLET | Freq: Four times a day (QID) | ORAL | Status: DC | PRN
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
## 2016-03-20 NOTE — Discharge Instructions (Signed)
Biliary Colic °Biliary colic is a pain in the upper abdomen. The pain: °· Is usually felt on the right side of the abdomen, but it may also be felt in the center of the abdomen, just below the breastbone (sternum). °· May spread back toward the right shoulder blade. °· May be steady or irregular. °· May be accompanied by nausea and vomiting. °Most of the time, the pain goes away in 1-5 hours. After the most intense pain passes, the abdomen may continue to ache mildly for about 24 hours. °Biliary colic is caused by a blockage in the bile duct. The bile duct is a pathway that carries bile--a liquid that helps to digest fats--from the gallbladder to the small intestine. Biliary colic usually occurs after eating, when the digestive system demands bile. The pain develops when muscle cells contract forcefully to try to move the blockage so that bile can get by. °HOME CARE INSTRUCTIONS °· Take medicines only as directed by your health care provider. °· Drink enough fluid to keep your urine clear or pale yellow. °· Avoid fatty, greasy, and fried foods. These kinds of foods increase your body's demand for bile. °· Avoid any foods that make your pain worse. °· Avoid overeating. °· Avoid having a large meal after fasting. °SEEK MEDICAL CARE IF: °· You develop a fever. °· Your pain gets worse. °· You vomit. °· You develop nausea that prevents you from eating and drinking. °SEEK IMMEDIATE MEDICAL CARE IF: °· You suddenly develop a fever and shaking chills. °· You develop a yellowish discoloration (jaundice) of: °¨ Skin. °¨ Whites of the eyes. °¨ Mucous membranes. °· You have continuous or severe pain that is not relieved with medicines. °· You have nausea and vomiting that is not relieved with medicines. °· You develop dizziness or you faint. °  °This information is not intended to replace advice given to you by your health care provider. Make sure you discuss any questions you have with your health care provider. °  °Document  Released: 02/21/2006 Document Revised: 02/04/2015 Document Reviewed: 07/02/2014 °Elsevier Interactive Patient Education ©2016 [employer] ° °

## 2016-03-25 ENCOUNTER — Other Ambulatory Visit: Payer: Self-pay | Admitting: Gastroenterology

## 2016-03-25 DIAGNOSIS — R112 Nausea with vomiting, unspecified: Secondary | ICD-10-CM

## 2016-03-25 DIAGNOSIS — R1011 Right upper quadrant pain: Secondary | ICD-10-CM

## 2016-04-02 ENCOUNTER — Ambulatory Visit
Admission: RE | Admit: 2016-04-02 | Discharge: 2016-04-02 | Disposition: A | Discharge status: Home / Self Care | Payer: 59 | Source: Ambulatory Visit | Attending: Gastroenterology | Admitting: Gastroenterology

## 2016-04-02 DIAGNOSIS — R112 Nausea with vomiting, unspecified: Secondary | ICD-10-CM

## 2016-04-02 DIAGNOSIS — R1011 Right upper quadrant pain: Secondary | ICD-10-CM

## 2016-04-02 IMAGING — US US ABDOMEN COMPLETE
1 series · 14 of 25 positions shown · non-contrast
Comparison: None

CLINICAL DATA: Nausea and vomiting for 1 month with back pain.
Third trimester pregnancy.

EXAM:
ABDOMEN ULTRASOUND COMPLETE

[Series 1: us abdomen complete · 0.30mm/px · 14 of 73 slices shown]
[im 1/73]
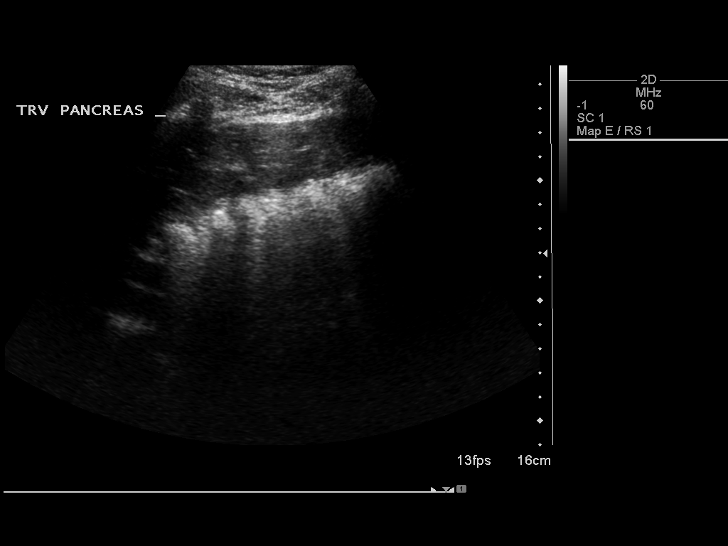
[im 7/73]
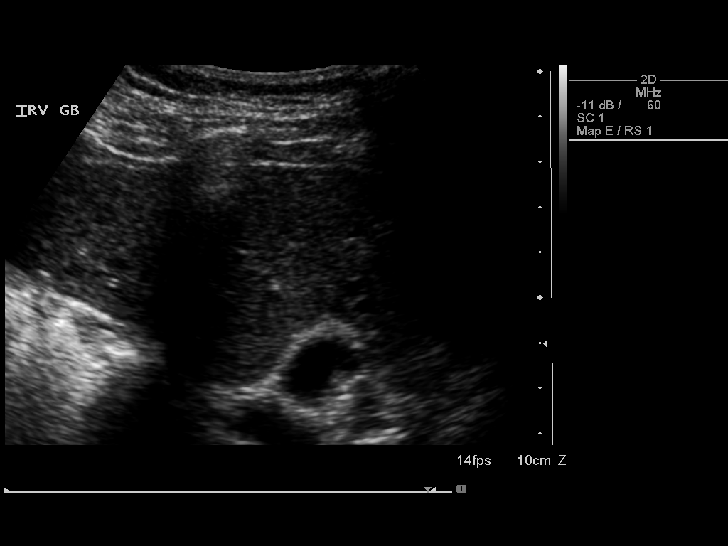
[im 13/73]
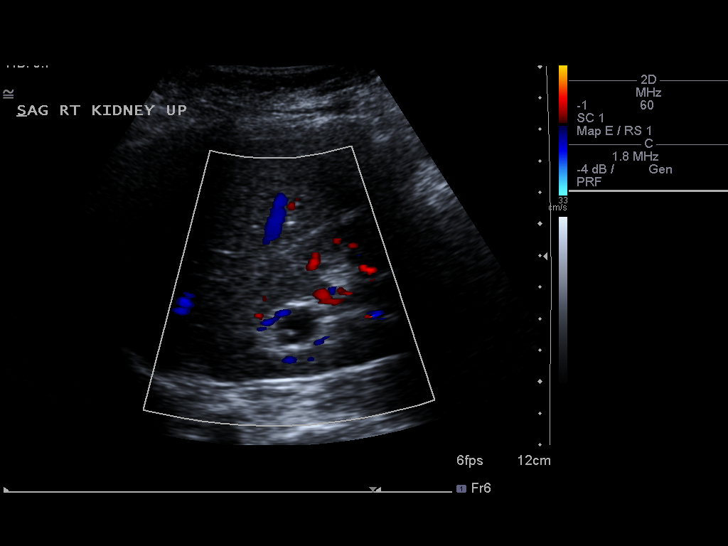
[im 19/73]
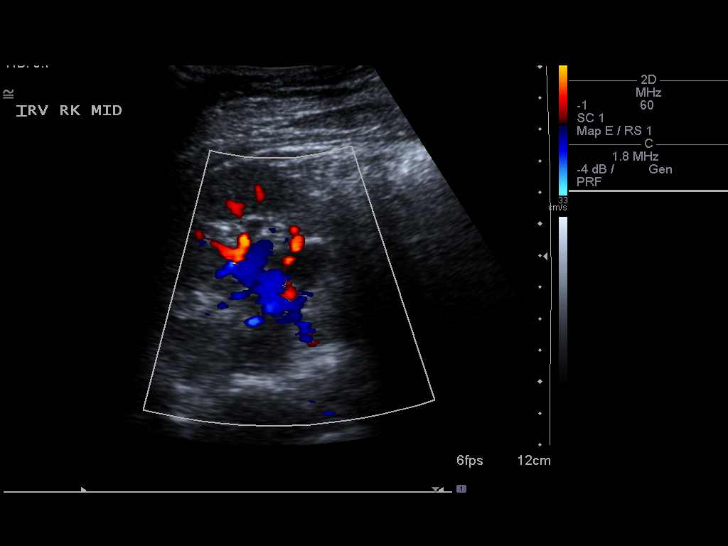
[im 25/73]
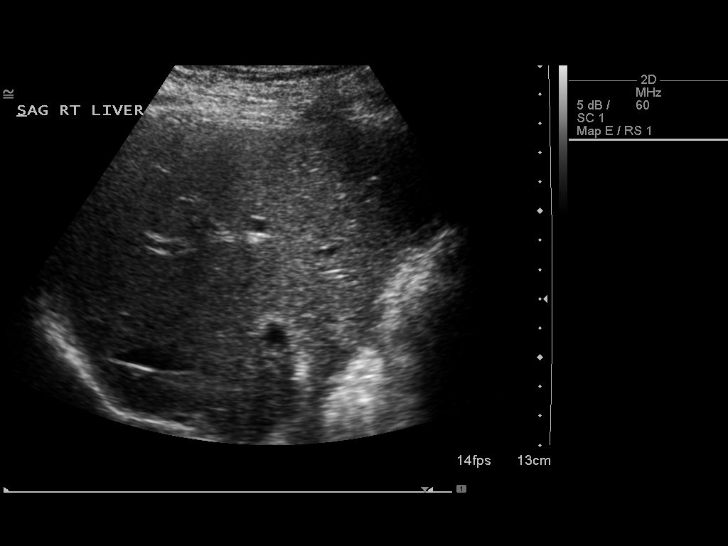
[im 28/73]
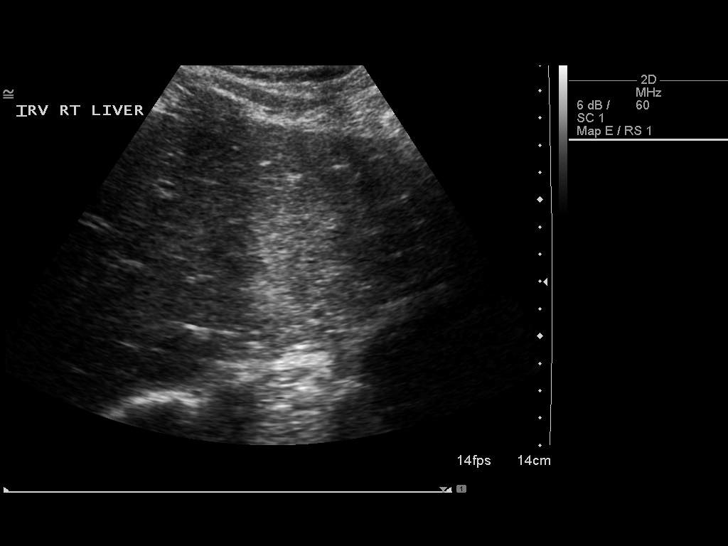
[im 34/73]
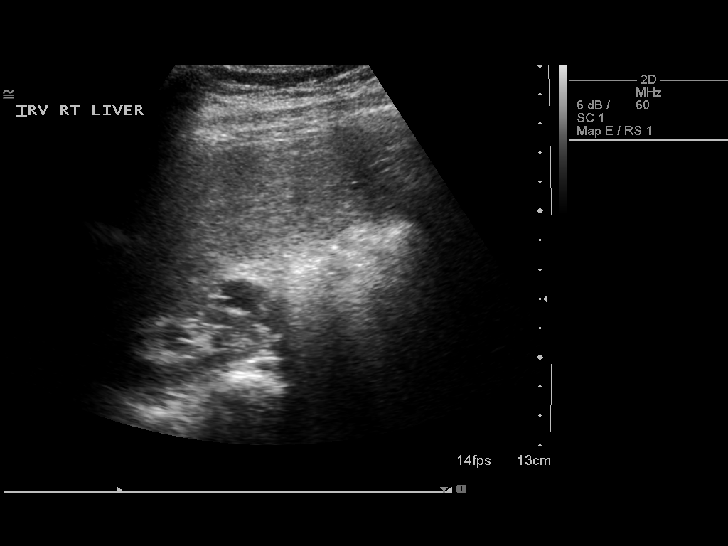
[im 40/73]
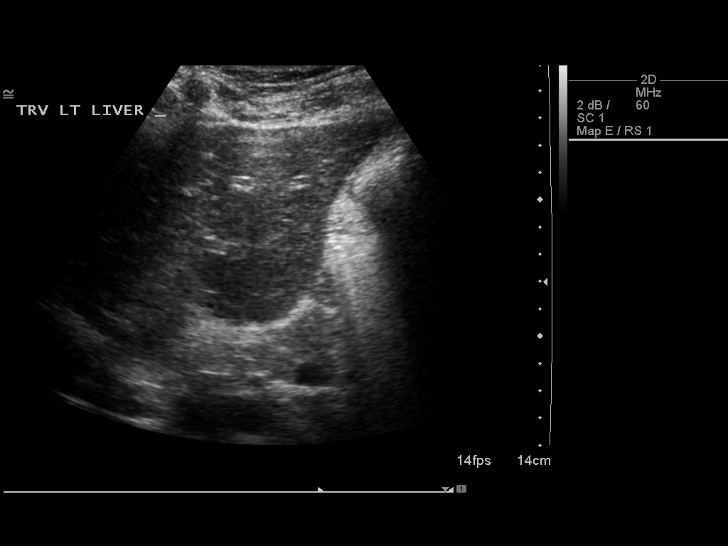
[im 46/73]
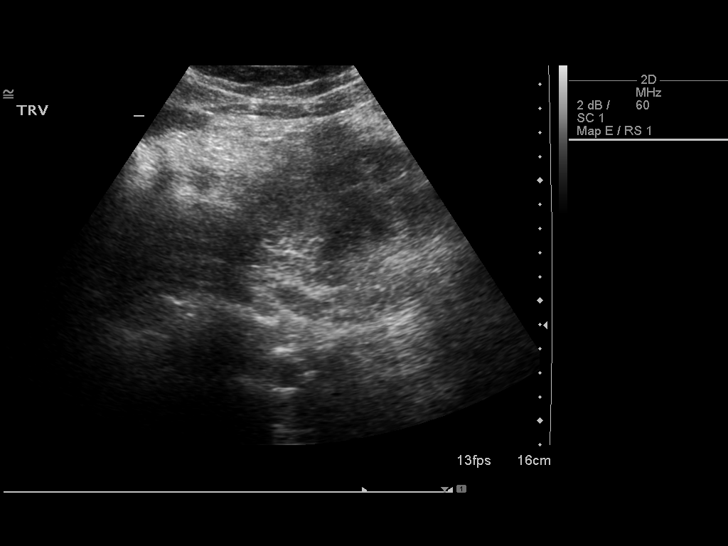
[im 49/73]
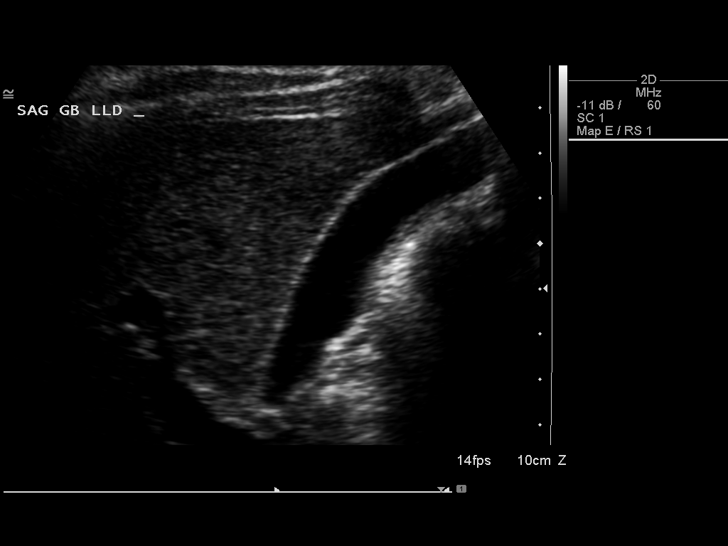
[im 55/73]
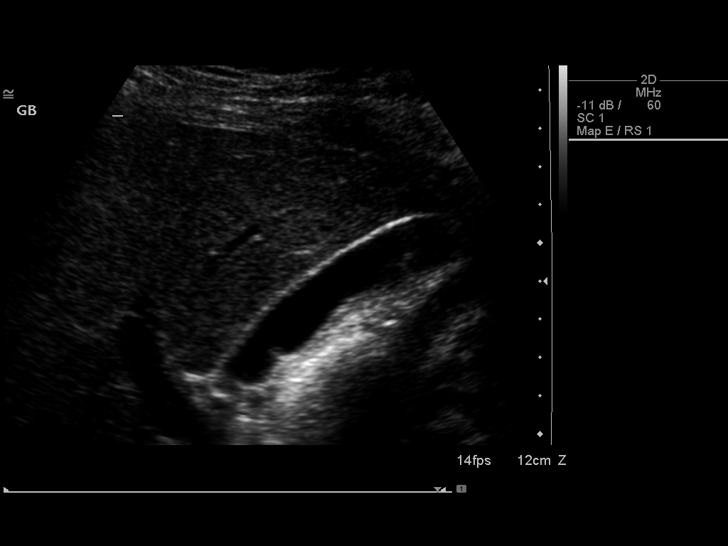
[im 61/73]
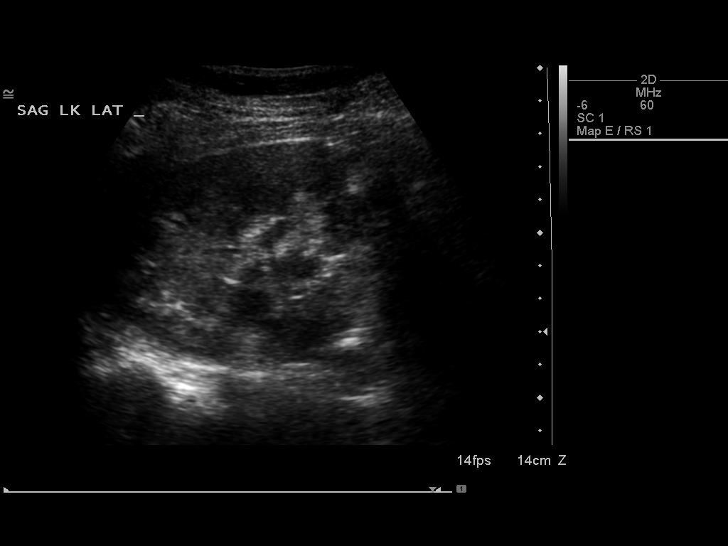
[im 67/73]
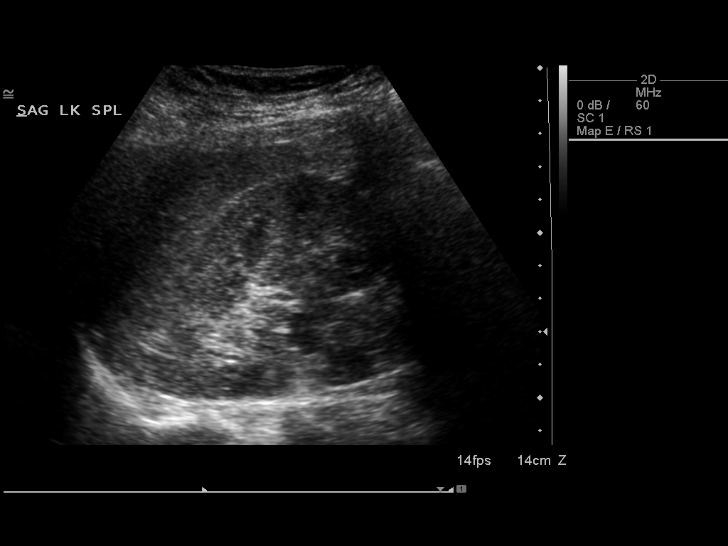
[im 73/73]
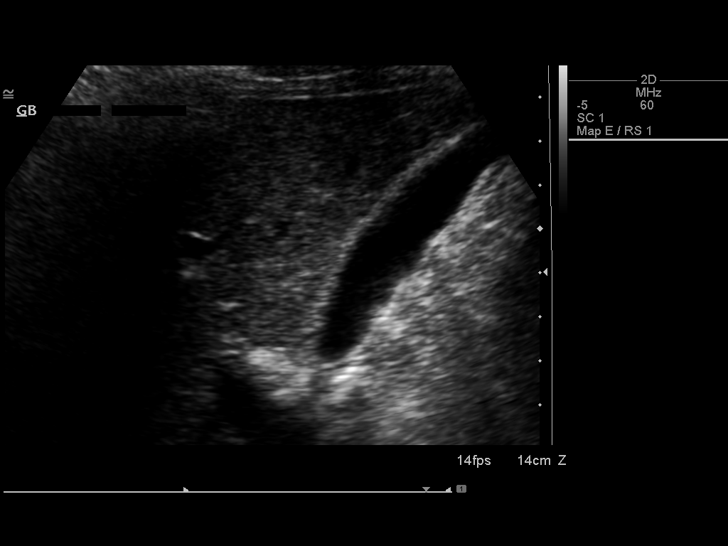

[14 of 25 positions shown; findings below may reference images not displayed]

FINDINGS: Gallbladder: No gallstones or wall thickening visualized. No
sonographic Murphy sign noted by sonographer.

Common bile duct: Diameter: 4 mm. Where visualized, no filling
defect.

Liver: No focal lesion identified. Within normal limits in
parenchymal echogenicity.

IVC: No abnormality visualized.

Pancreas: Obscured and not evaluated

Spleen: Size and appearance within normal limits.

Right Kidney: Length: 11.6 cm. Mild hydronephrosis. Negative for
mass or perinephric fluid.

Left Kidney: Length: 11.3 cm. Echogenicity within normal limits. No
mass or hydronephrosis visualized.

Abdominal aorta: Obscured

Other findings: None.
IMPRESSION: 1. Negative gallbladder.
2. Mild right hydronephrosis which could be related to gravid
uterus.
3. Pancreas and aorta obscured by bowel gas.

## 2016-04-12 ENCOUNTER — Encounter (HOSPITAL_COMMUNITY): Payer: Self-pay | Admitting: *Deleted

## 2016-04-12 ENCOUNTER — Inpatient Hospital Stay (HOSPITAL_COMMUNITY)
Admission: AD | Admit: 2016-04-12 | Discharge: 2016-04-12 | Disposition: A | Discharge status: Home / Self Care | Payer: 59 | Source: Ambulatory Visit | Attending: Obstetrics and Gynecology | Admitting: Obstetrics and Gynecology

## 2016-04-12 DIAGNOSIS — O4700 False labor before 37 completed weeks of gestation, unspecified trimester: Secondary | ICD-10-CM

## 2016-04-12 DIAGNOSIS — Z803 Family history of malignant neoplasm of breast: Secondary | ICD-10-CM | POA: Diagnosis not present

## 2016-04-12 DIAGNOSIS — Z888 Allergy status to other drugs, medicaments and biological substances status: Secondary | ICD-10-CM | POA: Insufficient documentation

## 2016-04-12 DIAGNOSIS — O479 False labor, unspecified: Secondary | ICD-10-CM

## 2016-04-12 DIAGNOSIS — Z8249 Family history of ischemic heart disease and other diseases of the circulatory system: Secondary | ICD-10-CM | POA: Diagnosis not present

## 2016-04-12 DIAGNOSIS — Z823 Family history of stroke: Secondary | ICD-10-CM | POA: Diagnosis not present

## 2016-04-12 DIAGNOSIS — Z3A34 34 weeks gestation of pregnancy: Secondary | ICD-10-CM | POA: Insufficient documentation

## 2016-04-12 LAB — URINALYSIS, ROUTINE W REFLEX MICROSCOPIC
Bilirubin Urine: NEGATIVE
Glucose, UA: NEGATIVE mg/dL
Ketones, ur: 15 mg/dL — AB
NITRITE: NEGATIVE
PH: 6 (ref 5.0–8.0)
Protein, ur: NEGATIVE mg/dL
SPECIFIC GRAVITY, URINE: 1.02 (ref 1.005–1.030)

## 2016-04-12 LAB — URINE MICROSCOPIC-ADD ON

## 2016-04-12 LAB — WET PREP, GENITAL
Clue Cells Wet Prep HPF POC: NONE SEEN
SPERM: NONE SEEN
TRICH WET PREP: NONE SEEN
YEAST WET PREP: NONE SEEN

## 2016-04-12 LAB — GC/CHLAMYDIA PROBE AMP (~~LOC~~) NOT AT ARMC
Chlamydia: NEGATIVE
Neisseria Gonorrhea: NEGATIVE

## 2016-04-12 MED ORDER — NIFEDIPINE 10 MG PO CAPS: 10.0000 mg | ORAL_CAPSULE | ORAL | Status: DC | PRN

## 2016-04-12 NOTE — Discharge Instructions (Signed)
Braxton Hicks Contractions °Contractions of the uterus can occur throughout pregnancy. Contractions are not always a sign that you are in labor.  °WHAT ARE BRAXTON HICKS CONTRACTIONS?  °Contractions that occur before labor are called Braxton Hicks contractions, or false labor. Toward the end of pregnancy (32-34 weeks), these contractions can develop more often and may become more forceful. This is not true labor because these contractions do not result in opening (dilatation) and thinning of the cervix. They are sometimes difficult to tell apart from true labor because these contractions can be forceful and people have different pain tolerances. You should not feel embarrassed if you go to the hospital with false labor. Sometimes, the only way to tell if you are in true labor is for your health care provider to look for changes in the cervix. °If there are no prenatal problems or other health problems associated with the pregnancy, it is completely safe to be sent home with false labor and await the onset of true labor. °HOW CAN YOU TELL THE DIFFERENCE BETWEEN TRUE AND FALSE LABOR? °False Labor °· The contractions of false labor are usually shorter and not as hard as those of true labor.   °· The contractions are usually irregular.   °· The contractions are often felt in the front of the lower abdomen and in the groin.   °· The contractions may go away when you walk around or change positions while lying down.   °· The contractions get weaker and are shorter lasting as time goes on.   °· The contractions do not usually become progressively stronger, regular, and closer together as with true labor.   °True Labor °· Contractions in true labor last 30-70 seconds, become very regular, usually become more intense, and increase in frequency.   °· The contractions do not go away with walking.   °· The discomfort is usually felt in the top of the uterus and spreads to the lower abdomen and low back.   °· True labor can be  determined by your health care provider with an exam. This will show that the cervix is dilating and getting thinner.   °WHAT TO REMEMBER °· Keep up with your usual exercises and follow other instructions given by your health care provider.   °· Take medicines as directed by your health care provider.   °· Keep your regular prenatal appointments.   °· Eat and drink lightly if you think you are going into labor.   °· If Braxton Hicks contractions are making you uncomfortable:   °¨ Change your position from lying down or resting to walking, or from walking to resting.   °¨ Sit and rest in a tub of warm water.   °¨ Drink 2-3 glasses of water. Dehydration may cause these contractions.   °¨ Do slow and deep breathing several times an hour.   °WHEN SHOULD I SEEK IMMEDIATE MEDICAL CARE? °Seek immediate medical care if: °· Your contractions become stronger, more regular, and closer together.   °· You have fluid leaking or gushing from your vagina.   °· You have a fever.   °· You pass blood-tinged mucus.   °· You have vaginal bleeding.   °· You have continuous abdominal pain.   °· You have low back pain that you never had before.   °· You feel your baby's head pushing down and causing pelvic pressure.   °· Your baby is not moving as much as it used to.   °  °This information is not intended to replace advice given to you by your health care provider. Make sure you discuss any questions you have with your health care   provider. °  °Document Released: 09/20/2005 Document Revised: 09/25/2013 Document Reviewed: 07/02/2013 °Elsevier Interactive Patient Education ©2016 Elsevier Inc. ° °

## 2016-04-12 NOTE — MAU Note (Signed)
Pt c/o contractions intermittently since midnight. States that at one time they were 6-8 mins apart. Denies LOF or vag bleeding. +FM.
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
## 2016-04-29 LAB — OB RESULTS CONSOLE GBS: GBS: NEGATIVE

## 2016-05-05 ENCOUNTER — Encounter (HOSPITAL_COMMUNITY): Payer: Self-pay | Admitting: *Deleted

## 2016-05-05 ENCOUNTER — Inpatient Hospital Stay (HOSPITAL_COMMUNITY)
Admission: AD | Admit: 2016-05-05 | Discharge: 2016-05-05 | Disposition: A | Discharge status: Home / Self Care | Payer: 59 | Source: Ambulatory Visit | Attending: Obstetrics and Gynecology | Admitting: Obstetrics and Gynecology

## 2016-05-05 DIAGNOSIS — M5388 Other specified dorsopathies, sacral and sacrococcygeal region: Secondary | ICD-10-CM | POA: Insufficient documentation

## 2016-05-05 DIAGNOSIS — M549 Dorsalgia, unspecified: Secondary | ICD-10-CM | POA: Diagnosis present

## 2016-05-05 DIAGNOSIS — O479 False labor, unspecified: Secondary | ICD-10-CM

## 2016-05-05 DIAGNOSIS — Z3A37 37 weeks gestation of pregnancy: Secondary | ICD-10-CM

## 2016-05-05 DIAGNOSIS — O26893 Other specified pregnancy related conditions, third trimester: Secondary | ICD-10-CM | POA: Diagnosis not present

## 2016-05-05 DIAGNOSIS — M5432 Sciatica, left side: Secondary | ICD-10-CM

## 2016-05-05 DIAGNOSIS — O9989 Other specified diseases and conditions complicating pregnancy, childbirth and the puerperium: Secondary | ICD-10-CM

## 2016-05-05 LAB — URINALYSIS, ROUTINE W REFLEX MICROSCOPIC
Bilirubin Urine: NEGATIVE
Glucose, UA: NEGATIVE mg/dL
Hgb urine dipstick: NEGATIVE
Ketones, ur: NEGATIVE mg/dL
LEUKOCYTES UA: NEGATIVE
Nitrite: NEGATIVE
PH: 5.5 (ref 5.0–8.0)
PROTEIN: NEGATIVE mg/dL
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
## 2016-05-05 NOTE — Discharge Instructions (Signed)

## 2016-05-27 ENCOUNTER — Telehealth (HOSPITAL_COMMUNITY): Payer: Self-pay | Admitting: *Deleted

## 2016-05-27 ENCOUNTER — Encounter (HOSPITAL_COMMUNITY): Payer: Self-pay | Admitting: *Deleted

## 2016-05-27 NOTE — Telephone Encounter (Signed)
Preadmission screen  

## 2016-05-28 ENCOUNTER — Encounter (HOSPITAL_COMMUNITY): Payer: Self-pay | Admitting: Certified Nurse Midwife

## 2016-05-28 ENCOUNTER — Inpatient Hospital Stay (HOSPITAL_COMMUNITY): Payer: 59 | Admitting: Anesthesiology

## 2016-05-28 ENCOUNTER — Other Ambulatory Visit: Payer: Self-pay | Admitting: Obstetrics and Gynecology

## 2016-05-28 ENCOUNTER — Inpatient Hospital Stay (HOSPITAL_COMMUNITY)
Admission: AD | Admit: 2016-05-28 | Discharge: 2016-05-30 | DRG: 775 | Disposition: A | Discharge status: Home / Self Care | Payer: 59 | Source: Ambulatory Visit | Attending: Obstetrics and Gynecology | Admitting: Obstetrics and Gynecology

## 2016-05-28 DIAGNOSIS — Z8249 Family history of ischemic heart disease and other diseases of the circulatory system: Secondary | ICD-10-CM

## 2016-05-28 DIAGNOSIS — Z3A40 40 weeks gestation of pregnancy: Secondary | ICD-10-CM | POA: Diagnosis not present

## 2016-05-28 DIAGNOSIS — Z803 Family history of malignant neoplasm of breast: Secondary | ICD-10-CM

## 2016-05-28 DIAGNOSIS — O480 Post-term pregnancy: Secondary | ICD-10-CM | POA: Diagnosis present

## 2016-05-28 DIAGNOSIS — Z823 Family history of stroke: Secondary | ICD-10-CM | POA: Diagnosis not present

## 2016-05-28 DIAGNOSIS — IMO0001 Reserved for inherently not codable concepts without codable children: Secondary | ICD-10-CM

## 2016-05-28 DIAGNOSIS — Z833 Family history of diabetes mellitus: Secondary | ICD-10-CM

## 2016-05-28 DIAGNOSIS — O4202 Full-term premature rupture of membranes, onset of labor within 24 hours of rupture: Secondary | ICD-10-CM | POA: Diagnosis present

## 2016-05-28 DIAGNOSIS — Z3403 Encounter for supervision of normal first pregnancy, third trimester: Secondary | ICD-10-CM | POA: Diagnosis present

## 2016-05-28 LAB — TYPE AND SCREEN
ABO/RH(D): O POS
ANTIBODY SCREEN: NEGATIVE

## 2016-05-28 LAB — CBC
HCT: 34.5 % — ABNORMAL LOW (ref 36.0–46.0)
Hemoglobin: 11.6 g/dL — ABNORMAL LOW (ref 12.0–15.0)
MCH: 29.8 pg (ref 26.0–34.0)
MCHC: 33.6 g/dL (ref 30.0–36.0)
MCV: 88.7 fL (ref 78.0–100.0)
PLATELETS: 276 10*3/uL (ref 150–400)
RBC: 3.89 MIL/uL (ref 3.87–5.11)
RDW: 14.6 % (ref 11.5–15.5)
WBC: 16.1 10*3/uL — AB (ref 4.0–10.5)

## 2016-05-28 LAB — ABO/RH: ABO/RH(D): O POS

## 2016-05-28 MED ORDER — FENTANYL 2.5 MCG/ML BUPIVACAINE 1/10 % EPIDURAL INFUSION (WH - ANES)
14.0000 mL/h | INTRAMUSCULAR | Status: DC | PRN
  Administered 2016-05-28: 14 mL/h via EPIDURAL
  Filled 2016-05-28: qty 125

## 2016-05-28 MED ORDER — SIMETHICONE 80 MG PO CHEW
80.0000 mg | CHEWABLE_TABLET | ORAL | Status: DC | PRN
  Administered 2016-05-29: 80 mg via ORAL
  Filled 2016-05-28: qty 1

## 2016-05-28 MED ORDER — OXYCODONE HCL 5 MG PO TABS: 10.0000 mg | ORAL_TABLET | ORAL | Status: DC | PRN

## 2016-05-28 MED ORDER — OXYCODONE HCL 5 MG PO TABS: 5.0000 mg | ORAL_TABLET | ORAL | Status: DC | PRN

## 2016-05-28 MED ORDER — OXYCODONE-ACETAMINOPHEN 5-325 MG PO TABS: 1.0000 | ORAL_TABLET | ORAL | Status: DC | PRN

## 2016-05-28 MED ORDER — PHENYLEPHRINE 40 MCG/ML (10ML) SYRINGE FOR IV PUSH (FOR BLOOD PRESSURE SUPPORT)
80.0000 ug | PREFILLED_SYRINGE | INTRAVENOUS | Status: DC | PRN
  Filled 2016-05-28: qty 5
  Filled 2016-05-28: qty 10

## 2016-05-28 MED ORDER — TERBUTALINE SULFATE 1 MG/ML IJ SOLN: 0.2500 mg | Freq: Once | INTRAMUSCULAR | Status: DC | PRN

## 2016-05-28 MED ORDER — ACETAMINOPHEN 325 MG PO TABS: 650.0000 mg | ORAL_TABLET | ORAL | Status: DC | PRN

## 2016-05-28 MED ORDER — LIDOCAINE HCL (PF) 1 % IJ SOLN: 30.0000 mL | INTRAMUSCULAR | Status: DC | PRN

## 2016-05-28 MED ORDER — LIDOCAINE HCL (PF) 1 % IJ SOLN
INTRAMUSCULAR | Status: DC | PRN
  Administered 2016-05-28 (×2): 5 mL

## 2016-05-28 MED ORDER — SENNOSIDES-DOCUSATE SODIUM 8.6-50 MG PO TABS
2.0000 | ORAL_TABLET | ORAL | Status: DC
  Administered 2016-05-29 (×2): 2 via ORAL
  Filled 2016-05-28 (×2): qty 2

## 2016-05-28 MED ORDER — WITCH HAZEL-GLYCERIN EX PADS: 1.0000 "application " | MEDICATED_PAD | CUTANEOUS | Status: DC | PRN

## 2016-05-28 MED ORDER — OXYTOCIN 40 UNITS IN LACTATED RINGERS INFUSION - SIMPLE MED: 2.5000 [IU]/h | INTRAVENOUS | Status: DC

## 2016-05-28 MED ORDER — DIBUCAINE 1 % RE OINT: 1.0000 "application " | TOPICAL_OINTMENT | RECTAL | Status: DC | PRN

## 2016-05-28 MED ORDER — ZOLPIDEM TARTRATE 5 MG PO TABS: 5.0000 mg | ORAL_TABLET | Freq: Every evening | ORAL | Status: DC | PRN

## 2016-05-28 MED ORDER — LIDOCAINE HCL (PF) 1 % IJ SOLN
30.0000 mL | INTRAMUSCULAR | Status: DC | PRN
  Filled 2016-05-28: qty 30

## 2016-05-28 MED ORDER — LACTATED RINGERS IV SOLN: 500.0000 mL | INTRAVENOUS | Status: DC | PRN

## 2016-05-28 MED ORDER — FLEET ENEMA 7-19 GM/118ML RE ENEM: 1.0000 | ENEMA | RECTAL | Status: DC | PRN

## 2016-05-28 MED ORDER — EPHEDRINE 5 MG/ML INJ
10.0000 mg | INTRAVENOUS | Status: DC | PRN
  Filled 2016-05-28: qty 4

## 2016-05-28 MED ORDER — OXYTOCIN 40 UNITS IN LACTATED RINGERS INFUSION - SIMPLE MED: 1.0000 m[IU]/min | INTRAVENOUS | Status: DC

## 2016-05-28 MED ORDER — ONDANSETRON HCL 4 MG/2ML IJ SOLN: 4.0000 mg | INTRAMUSCULAR | Status: DC | PRN

## 2016-05-28 MED ORDER — PHENYLEPHRINE 40 MCG/ML (10ML) SYRINGE FOR IV PUSH (FOR BLOOD PRESSURE SUPPORT)
80.0000 ug | PREFILLED_SYRINGE | INTRAVENOUS | Status: DC | PRN
  Filled 2016-05-28: qty 5

## 2016-05-28 MED ORDER — IBUPROFEN 600 MG PO TABS
600.0000 mg | ORAL_TABLET | Freq: Four times a day (QID) | ORAL | Status: DC
  Administered 2016-05-29 – 2016-05-30 (×6): 600 mg via ORAL
  Filled 2016-05-28 (×6): qty 1

## 2016-05-28 MED ORDER — OXYTOCIN BOLUS FROM INFUSION: 500.0000 mL | Freq: Once | INTRAVENOUS | Status: DC

## 2016-05-28 MED ORDER — ACETAMINOPHEN 325 MG PO TABS
650.0000 mg | ORAL_TABLET | ORAL | Status: DC | PRN
Start: 2016-05-28 — End: 2016-05-28

## 2016-05-28 MED ORDER — BENZOCAINE-MENTHOL 20-0.5 % EX AERO
1.0000 "application " | INHALATION_SPRAY | CUTANEOUS | Status: DC | PRN
  Administered 2016-05-29: 1 via TOPICAL
  Filled 2016-05-28: qty 56

## 2016-05-28 MED ORDER — PRENATAL MULTIVITAMIN CH
1.0000 | ORAL_TABLET | Freq: Every day | ORAL | Status: DC
Start: 2016-05-29 — End: 2016-05-30
  Administered 2016-05-29: 1 via ORAL
  Filled 2016-05-28: qty 1

## 2016-05-28 MED ORDER — TETANUS-DIPHTH-ACELL PERTUSSIS 5-2.5-18.5 LF-MCG/0.5 IM SUSP
0.5000 mL | Freq: Once | INTRAMUSCULAR | Status: DC
Start: 2016-05-29 — End: 2016-05-30

## 2016-05-28 MED ORDER — SOD CITRATE-CITRIC ACID 500-334 MG/5ML PO SOLN: 30.0000 mL | ORAL | Status: DC | PRN

## 2016-05-28 MED ORDER — FENTANYL CITRATE (PF) 100 MCG/2ML IJ SOLN: 50.0000 ug | INTRAMUSCULAR | Status: DC | PRN

## 2016-05-28 MED ORDER — DIPHENHYDRAMINE HCL 50 MG/ML IJ SOLN: 12.5000 mg | INTRAMUSCULAR | Status: DC | PRN

## 2016-05-28 MED ORDER — MEDROXYPROGESTERONE ACETATE 150 MG/ML IM SUSP: 150.0000 mg | INTRAMUSCULAR | Status: DC | PRN

## 2016-05-28 MED ORDER — SOD CITRATE-CITRIC ACID 500-334 MG/5ML PO SOLN
30.0000 mL | ORAL | Status: DC | PRN
Start: 2016-05-28 — End: 2016-05-28

## 2016-05-28 MED ORDER — MISOPROSTOL 25 MCG QUARTER TABLET
25.0000 ug | ORAL_TABLET | ORAL | Status: DC | PRN
  Filled 2016-05-28: qty 1

## 2016-05-28 MED ORDER — LACTATED RINGERS IV SOLN: INTRAVENOUS | Status: DC

## 2016-05-28 MED ORDER — LACTATED RINGERS IV SOLN
INTRAVENOUS | Status: DC
  Administered 2016-05-28 (×2): via INTRAVENOUS

## 2016-05-28 MED ORDER — LACTATED RINGERS IV SOLN
500.0000 mL | Freq: Once | INTRAVENOUS | Status: AC
  Administered 2016-05-28: 500 mL via INTRAVENOUS

## 2016-05-28 MED ORDER — ONDANSETRON HCL 4 MG PO TABS: 4.0000 mg | ORAL_TABLET | ORAL | Status: DC | PRN

## 2016-05-28 MED ORDER — COCONUT OIL OIL: 1.0000 "application " | TOPICAL_OIL | Status: DC | PRN

## 2016-05-28 MED ORDER — OXYCODONE-ACETAMINOPHEN 5-325 MG PO TABS: 2.0000 | ORAL_TABLET | ORAL | Status: DC | PRN

## 2016-05-28 MED ORDER — ONDANSETRON HCL 4 MG/2ML IJ SOLN: 4.0000 mg | Freq: Four times a day (QID) | INTRAMUSCULAR | Status: DC | PRN

## 2016-05-28 MED ORDER — OXYTOCIN 40 UNITS IN LACTATED RINGERS INFUSION - SIMPLE MED
2.5000 [IU]/h | INTRAVENOUS | Status: DC
  Filled 2016-05-28: qty 1000

## 2016-05-28 MED ORDER — OXYTOCIN BOLUS FROM INFUSION
500.0000 mL | Freq: Once | INTRAVENOUS | Status: AC
  Administered 2016-05-28: 500 mL via INTRAVENOUS
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
## 2016-05-28 NOTE — Progress Notes (Signed)
Assisted pt up to bathroom to void, gait steady. Pt voided without difficulty. Peri care taught. Pt tolerated ambulation well.

## 2016-05-28 NOTE — Anesthesia Preprocedure Evaluation (Signed)

## 2016-05-28 NOTE — Anesthesia Procedure Notes (Signed)
Epidural Patient location during procedure: OB  Staffing Anesthesiologist: Evalisse Prajapati Performed: anesthesiologist   Preanesthetic Checklist Completed: patient identified, site marked, surgical consent, pre-op evaluation, timeout performed, IV checked, risks and benefits discussed and monitors and equipment checked  Epidural Patient position: sitting Prep: DuraPrep Patient monitoring: heart rate, continuous pulse ox and blood pressure Approach: right paramedian Location: L3-L4 Injection technique: LOR saline  Needle:  Needle type: Tuohy  Needle gauge: 17 G Needle length: 9 cm and 9 Needle insertion depth: 7 cm Catheter type: closed end flexible Catheter size: 20 Guage Catheter at skin depth: 12 cm Test dose: negative  Assessment Events: blood not aspirated, injection not painful, no injection resistance, negative IV test and no paresthesia  Additional Notes Patient identified. Risks/Benefits/Options discussed with patient including but not limited to bleeding, infection, nerve damage, paralysis, failed block, incomplete pain control, headache, blood pressure changes, nausea, vomiting, reactions to medication both or allergic, itching and postpartum back pain. Confirmed with bedside nurse the patient's most recent platelet count. Confirmed with patient that they are not currently taking any anticoagulation, have any bleeding history or any family history of bleeding disorders. Patient expressed understanding and wished to proceed. All questions were answered. Sterile technique was used throughout the entire procedure. Please see nursing notes for vital signs. Test dose was given through epidural needle and negative prior to continuing to dose epidural or start infusion. Warning signs of high block given to the patient including shortness of breath, tingling/numbness in hands, complete motor block, or any concerning symptoms with instructions to call for help. Patient was given  instructions on fall risk and not to get out of bed. All questions and concerns addressed with instructions to call with any issues.     

## 2016-05-28 NOTE — MAU Note (Signed)
Pt states she has been having ctxs all day. Denies LOF or vaginal bleeding. Fetus active.

## 2016-05-28 NOTE — Anesthesia Pain Management Evaluation Note (Signed)
  CRNA Pain Management Visit Note  Patient: [name] [name], 35 y.o., female  "Hello I am a member of the anesthesia team at [facility]. We have an anesthesia team available at all times to provide care throughout the hospital, including epidural management and anesthesia for C-section. I don't know your plan for the delivery whether it a natural birth, water birth, IV sedation, nitrous supplementation, doula or epidural, but we want to meet your pain goals."   1.Was your pain managed to your expectations on prior hospitalizations?   Yes   2.What is your expectation for pain management during this hospitalization?     Epidural  3.How can we help you reach that goal? epidural  Record the patient's initial score and the patient's pain goal.   Pain: 4  Pain Goal: 6 [facility] wants you to be able to say your pain was always managed very well.  Cephus ShellingBURGER,Dezirea Mccollister 05/28/2016

## 2016-05-29 LAB — RPR: RPR Ser Ql: NONREACTIVE

## 2016-05-29 LAB — CBC
HEMATOCRIT: 31.6 % — AB (ref 36.0–46.0)
Hemoglobin: 10.6 g/dL — ABNORMAL LOW (ref 12.0–15.0)
MCH: 29.6 pg (ref 26.0–34.0)
MCHC: 33.5 g/dL (ref 30.0–36.0)
MCV: 88.3 fL (ref 78.0–100.0)
PLATELETS: 248 10*3/uL (ref 150–400)
RBC: 3.58 MIL/uL — ABNORMAL LOW (ref 3.87–5.11)
RDW: 14.4 % (ref 11.5–15.5)
WBC: 17.8 10*3/uL — ABNORMAL HIGH (ref 4.0–10.5)

## 2016-05-29 NOTE — Discharge Instructions (Signed)

## 2016-05-29 NOTE — Lactation Note (Signed)
This note was copied from a baby's chart. Lactation Consultation Note  Patient Name: [name] [name]'s Date: 05/29/2016 Reason for consult: Follow-up assessment Parents requesting lactation to check latch. Upon entry baby was done bf. Parents are worried baby is not eating enough. Discussed baby behavior, feeding frequency, how to look for swallowing, baby belly size, voids, wt loss, breast changes, and nipple care. Mom stated she know how to manually express and has spoon in room. Parents are aware of lactation services and support group. They will call as needed.    Maternal Data    Feeding Feeding Type: Breast Fed Length of feed: 10 min  LATCH Score/Interventions Latch: Grasps breast easily, tongue down, lips flanged, rhythmical sucking.  Audible Swallowing: None Intervention(s): Alternate breast massage;Hand expression  Type of Nipple: Everted at rest and after stimulation  Comfort (Breast/Nipple): Soft / non-tender     Hold (Positioning): Assistance needed to correctly position infant at breast and maintain latch.  LATCH Score: 7  Lactation Tools Discussed/Used     Consult Status Consult Status: Follow-up Date: 05/30/16 Follow-up type: In-patient    Rulon Eisenmengerlizabeth E Ritika Hellickson 05/29/2016, 8:15 PM

## 2016-05-29 NOTE — Lactation Note (Signed)
This note was copied from a baby's chart. Lactation Consultation Note Baby was circumcised and is sleepy. Mom to page for next feeding.  Patient Name: [name] [name] [id number]'s Date: 05/29/2016 Reason for consult: Initial assessment   Maternal Data Does the patient have breastfeeding experience prior to this delivery?: Yes  Feeding Feeding Type: Breast Fed Length of feed: 30 min  LATCH Score/Interventions Latch: Grasps breast easily, tongue down, lips flanged, rhythmical sucking.  Audible Swallowing: A few with stimulation Intervention(s): Skin to skin  Type of Nipple: Everted at rest and after stimulation  Comfort (Breast/Nipple): Soft / non-tender     Hold (Positioning): Assistance needed to correctly position infant at breast and maintain latch.  LATCH Score: 8  Lactation Tools Discussed/Used     Consult Status Consult Status: Follow-up Date: 05/30/16 Follow-up type: In-patient    Soyla DryerJoseph, Nnaemeka Samson 05/29/2016, 11:48 AM

## 2016-05-29 NOTE — Anesthesia Postprocedure Evaluation (Signed)
Anesthesia Post Note  Patient: [name] [name]  Procedure(s) Performed: * No procedures listed *  Patient location during evaluation: Mother Baby Anesthesia Type: Epidural Level of consciousness: awake and alert, oriented and patient cooperative Pain management: pain level controlled Vital Signs Assessment: post-procedure vital signs reviewed and stable Respiratory status: spontaneous breathing Cardiovascular status: stable Postop Assessment: no headache, epidural receding, patient able to bend at knees and no signs of nausea or vomiting Anesthetic complications: no Comments: Pt reports pain score of 0.     Last Vitals:  Vitals:   05/28/16 2100 05/29/16 0100  BP: 138/77 121/64  Pulse: 87 88  Resp: 20 18  Temp:      Last Pain:  Vitals:   05/29/16 0100  TempSrc:   PainSc: 1    Pain Goal: Patients Stated Pain Goal: 6 (05/28/16 1652)               Merrilyn PumaWRINKLE,Jaelan Rasheed

## 2016-05-29 NOTE — Discharge Summary (Signed)
[facility]   Patient Name:   [name] [name] DOB:     [birth year] MRN:     [id number]  Date of Admission:   05/28/2016 Date of Discharge:  05/30/2016  Admitting diagnosis:    40WK LABOR Principal Problem:   Vaginal delivery Active Problems:   Active labor at term   Post term pregnancy, antepartum condition or complication      Discharge diagnosis:    40WK LABOR Principal Problem:   Vaginal delivery Active Problems:   Active labor at term   Post term pregnancy, antepartum condition or complication                                                                  Post partum procedures: NA  Type of Delivery:  SVB  Delivering Provider: Osborn CohoOBERTS, ANGELA   Date of Delivery:  05/28/16  Newborn Data:    Live born female  Birth Weight: 8 lb 2.5 oz (3700 g) APGAR: 8, 9   Baby Feeding:   Breast Disposition:   home with mother  Complications:   None  Hospital course:      Onset of Labor With Vaginal Delivery     35 y.o. yo [id number] at 493w4d was admitted in Active Labor on 05/28/2016. Patient had an uncomplicated labor course as follows:  Membrane Rupture Time/Date: 4:58 PM ,05/28/2016   Intrapartum Procedures: Episiotomy: None [1]                                         Lacerations:  1st degree [2];Vaginal [6]  Patient had a delivery of a Viable infant. 05/28/2016  Information for the patient's newborn:  Ulis RiasSherrod, Boy Lela [[id number]][[id number]]  Delivery Method: CS-LTranv    Pateint had an uncomplicated postpartum course.  She is ambulating, tolerating a regular diet, passing flatus, and urinating well. Patient is discharged home in stable condition on 05/30/16.    Physical Exam:   Vitals:   05/28/16 2000 05/28/16 2100 05/29/16 0100 05/29/16 1820  BP: 114/78 138/77 121/64 126/76  Pulse: 76 87 88 84  Resp: 18 20 18 16   Temp: 98.9 F (37.2 C)   97.3 F (36.3 C)  TempSrc: Oral   Oral  SpO2:    100%  Weight:      Height:       General:  alert Lochia: appropriate Uterine Fundus: firm Incision: Healing well with no significant drainage DVT Evaluation: No evidence of DVT seen on physical exam. Negative Homan's sign.  Labs:  CBC Latest Ref Rng & Units 05/29/2016 05/28/2016 03/20/2016  WBC 4.0 - 10.5 K/uL 17.8(H) 16.1(H) 9.0  Hemoglobin 12.0 - 15.0 g/dL 10.6(L) 11.6(L) 11.0(L)  Hematocrit 36.0 - 46.0 % 31.6(L) 34.5(L) 32.3(L)  Platelets 150 - 400 K/uL 248 276 279     Discharge instruction: per After Visit Summary and "Baby and Me Booklet".  After Visit Meds:    Medication List    STOP taking these medications   promethazine 12.5 MG tablet Commonly known as:  PHENERGAN     TAKE these medications   ibuprofen 600 MG tablet Commonly known as:  ADVIL,MOTRIN Take 1 tablet (600  mg total) by mouth every 6 (six) hours as needed.   pantoprazole 20 MG tablet Commonly known as:  PROTONIX Take 20 mg by mouth daily.   prenatal multivitamin Tabs tablet Take 1 tablet by mouth daily.       Diet: routine diet  Activity: Advance as tolerated. Pelvic rest for 6 weeks.   Outpatient follow up:6 weeks Follow up Appt:No future appointments. Follow up visit: No Follow-up on file.  Postpartum contraception: Undecided--options reviewed  05/30/2016 Nigel Bridgeman, CNM

## 2016-05-29 NOTE — Lactation Note (Signed)
This note was copied from a baby's chart. Lactation Consultation Note  Patient Name: [name] Arley Phenixshlee Van GNFAO'ZToday's Date: 05/29/2016 Reason for consult: Follow-up assessment Infant is 24 hours old & [facility]. Mom called LC to help with latch. When LC was able to go to room, baby was asleep and mom stated he just finished feeding for ~10 mins. Mom had questions whether that latch was correct so she tried latching baby again in cradle hold with no pillow support. Baby latched and started suckling. Suggested mom get more comfortable with pillow support & try cross-cradle hold so she could do breast compressions while nursing. Baby had good latch but no swallows were noted; explained that since he recently fed he may be doing non-nutritive suckling. Encouraged mom to call for LC at next feeding to assist with latch and assess for swallows. Mom reports no other questions at this time.   Maternal Data    Feeding Feeding Type: Breast Fed Length of feed: 10 min  LATCH Score/Interventions Latch: Grasps breast easily, tongue down, lips flanged, rhythmical sucking.  Audible Swallowing: None Intervention(s): Alternate breast massage;Hand expression  Type of Nipple: Everted at rest and after stimulation  Comfort (Breast/Nipple): Soft / non-tender     Hold (Positioning): Assistance needed to correctly position infant at breast and maintain latch.  LATCH Score: 7  Lactation Tools Discussed/Used     Consult Status Consult Status: Follow-up Date: 05/30/16 Follow-up type: In-patient    Oneal GroutLaura C Consuella Scurlock 05/29/2016, 6:06 PM

## 2016-05-29 NOTE — Progress Notes (Signed)
Post Partum Day 1 Subjective: no complaints, up ad lib, voiding and tolerating PO  Objective: Blood pressure 121/64, pulse 88, temperature 98.9 F (37.2 C), temperature source Oral, resp. rate 18, height 5\' 1"  (1.549 m), weight 198 lb (89.8 kg), SpO2 100 %, unknown if currently breastfeeding.  Physical Exam:  General: alert and no distress Lochia: appropriate Uterine Fundus: firm Incision: n/a DVT Evaluation: No evidence of DVT seen on physical exam.   Recent Labs  05/28/16 1532 05/29/16 0701  HGB 11.6* 10.6*  HCT 34.5* 31.6*    Assessment/Plan: Doing Well Plan for discharge tomorrow and Breastfeeding   LOS: 1 day   [name] [name] Y 05/29/2016, 10:23 AM
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
## 2016-05-30 MED ORDER — IBUPROFEN 600 MG PO TABS: 600.0000 mg | ORAL_TABLET | Freq: Four times a day (QID) | ORAL | 2 refills | Status: DC | PRN

## 2016-05-31 ENCOUNTER — Inpatient Hospital Stay (HOSPITAL_COMMUNITY): Admission: RE | Admit: 2016-05-31 | Payer: 59 | Source: Ambulatory Visit

## 2017-01-26 ENCOUNTER — Emergency Department (HOSPITAL_COMMUNITY): Payer: 59

## 2017-01-26 ENCOUNTER — Emergency Department (HOSPITAL_COMMUNITY)
Admission: EM | Admit: 2017-01-26 | Discharge: 2017-01-26 | Disposition: A | Discharge status: Home / Self Care | Payer: 59 | Attending: Emergency Medicine | Admitting: Emergency Medicine

## 2017-01-26 ENCOUNTER — Encounter (HOSPITAL_COMMUNITY): Payer: Self-pay | Admitting: Emergency Medicine

## 2017-01-26 DIAGNOSIS — Z7982 Long term (current) use of aspirin: Secondary | ICD-10-CM | POA: Insufficient documentation

## 2017-01-26 DIAGNOSIS — R072 Precordial pain: Secondary | ICD-10-CM | POA: Diagnosis not present

## 2017-01-26 DIAGNOSIS — R079 Chest pain, unspecified: Secondary | ICD-10-CM

## 2017-01-26 DIAGNOSIS — Z79899 Other long term (current) drug therapy: Secondary | ICD-10-CM | POA: Insufficient documentation

## 2017-01-26 DIAGNOSIS — R0602 Shortness of breath: Secondary | ICD-10-CM | POA: Insufficient documentation

## 2017-01-26 LAB — CBC WITH DIFFERENTIAL/PLATELET
BASOS ABS: 0 10*3/uL (ref 0.0–0.1)
BASOS PCT: 0 %
EOS ABS: 0 10*3/uL (ref 0.0–0.7)
Eosinophils Relative: 1 %
HEMATOCRIT: 38.9 % (ref 36.0–46.0)
Hemoglobin: 13.1 g/dL (ref 12.0–15.0)
Lymphocytes Relative: 48 %
Lymphs Abs: 3.2 10*3/uL (ref 0.7–4.0)
MCH: 30.6 pg (ref 26.0–34.0)
MCHC: 33.7 g/dL (ref 30.0–36.0)
MCV: 90.9 fL (ref 78.0–100.0)
MONO ABS: 0.4 10*3/uL (ref 0.1–1.0)
Monocytes Relative: 6 %
NEUTROS ABS: 3 10*3/uL (ref 1.7–7.7)
NEUTROS PCT: 45 %
Platelets: 334 10*3/uL (ref 150–400)
RBC: 4.28 MIL/uL (ref 3.87–5.11)
RDW: 12.9 % (ref 11.5–15.5)
WBC: 6.6 10*3/uL (ref 4.0–10.5)

## 2017-01-26 LAB — BASIC METABOLIC PANEL
ANION GAP: 7 (ref 5–15)
BUN: 7 mg/dL (ref 6–20)
CHLORIDE: 105 mmol/L (ref 101–111)
CO2: 26 mmol/L (ref 22–32)
Calcium: 9.2 mg/dL (ref 8.9–10.3)
Creatinine, Ser: 0.72 mg/dL (ref 0.44–1.00)
GFR calc non Af Amer: >60 mL/min (ref >60)
GLUCOSE: 81 mg/dL (ref 65–99)
Potassium: 3.8 mmol/L (ref 3.5–5.1)
SODIUM: 138 mmol/L (ref 135–145)

## 2017-01-26 LAB — I-STAT TROPONIN, ED
TROPONIN I, POC: 0 ng/mL (ref 0.00–0.08)
TROPONIN I, POC: 0 ng/mL (ref 0.00–0.08)

## 2017-01-26 LAB — I-STAT BETA HCG BLOOD, ED (MC, WL, AP ONLY): I-stat hCG, quantitative: <5 m[IU]/mL (ref <5)

## 2017-01-26 LAB — D-DIMER, QUANTITATIVE (NOT AT ARMC): D DIMER QUANT: 0.28 ug{FEU}/mL (ref 0.00–0.50)

## 2017-01-26 LAB — BRAIN NATRIURETIC PEPTIDE: B NATRIURETIC PEPTIDE 5: 9.6 pg/mL (ref 0.0–100.0)

## 2017-01-26 IMAGING — DX DG CHEST 2V
2 series · 2 of 2 positions shown · non-contrast
Comparison: [DATE]

CLINICAL DATA: Chest pain and dizziness

EXAM:
CHEST  2 VIEW

[chest pa]
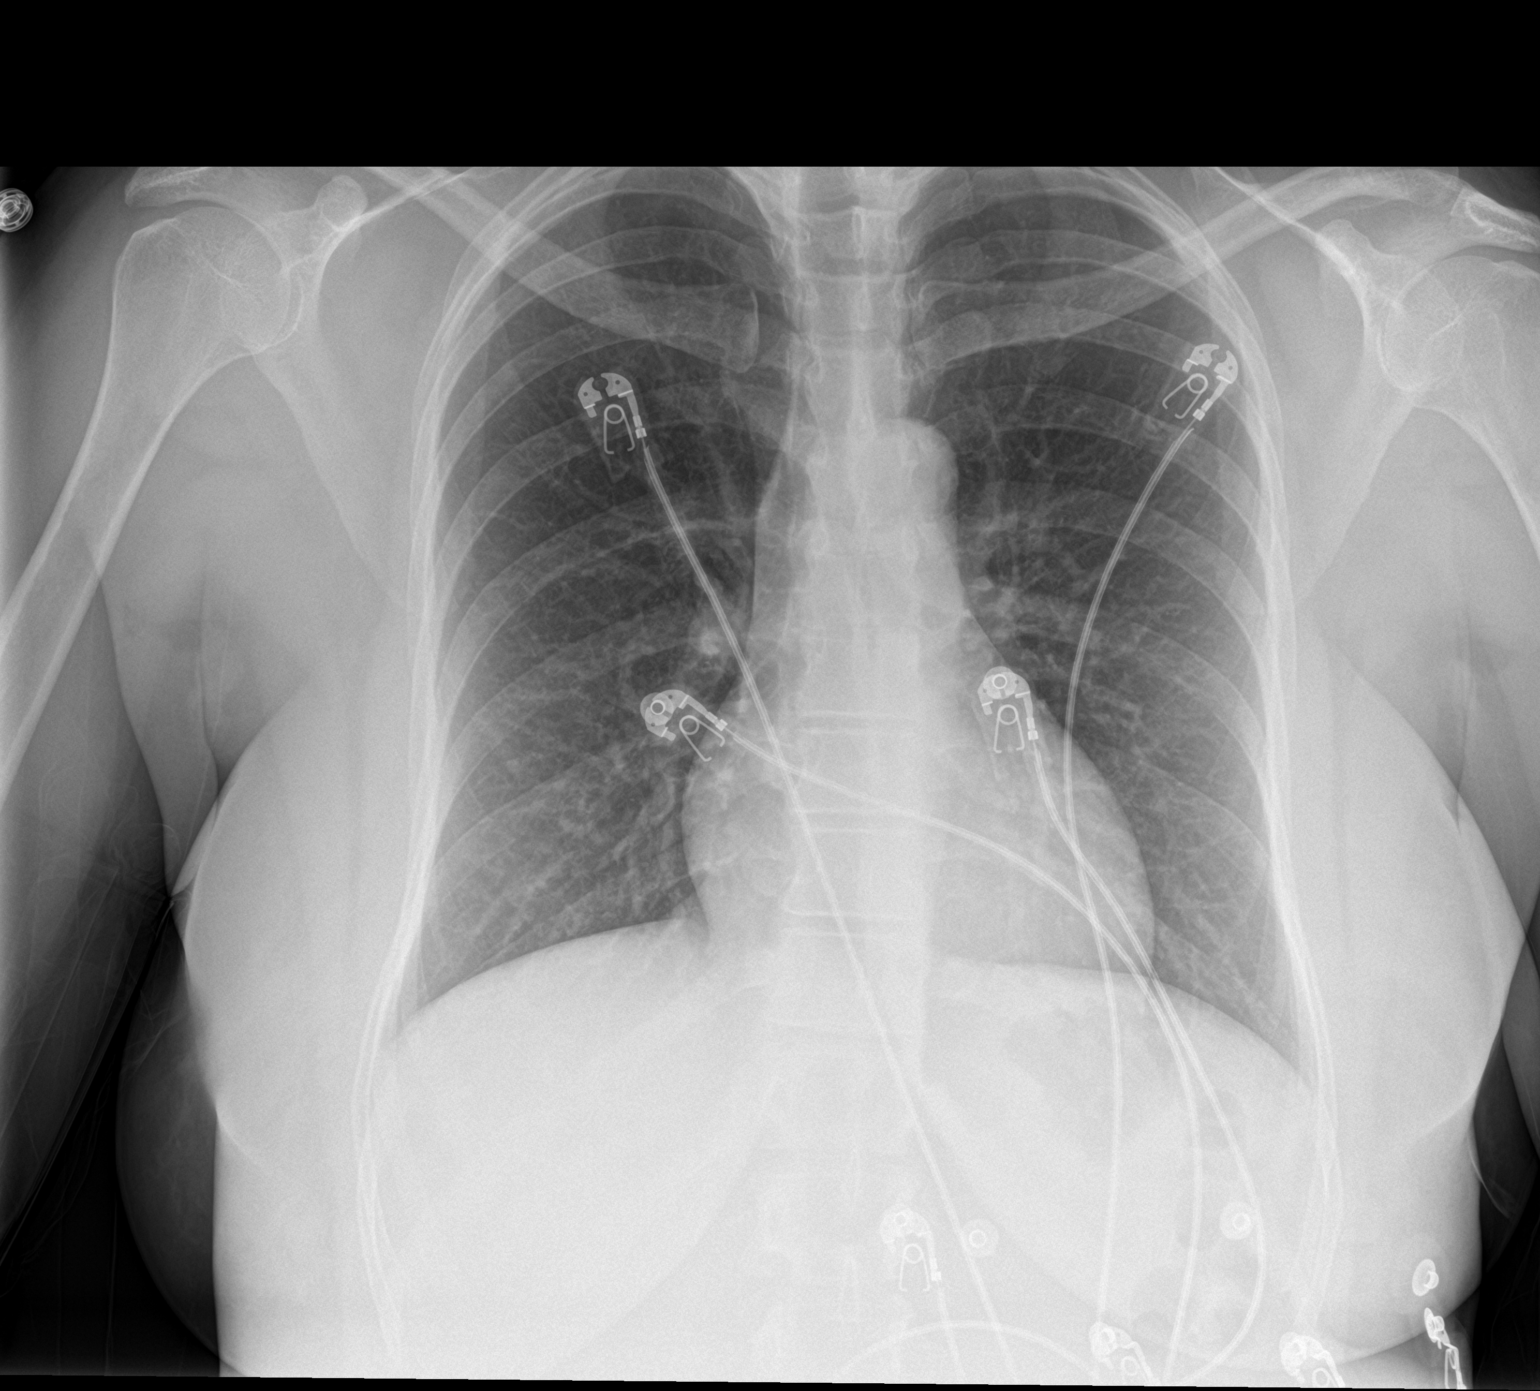

[chest lat]
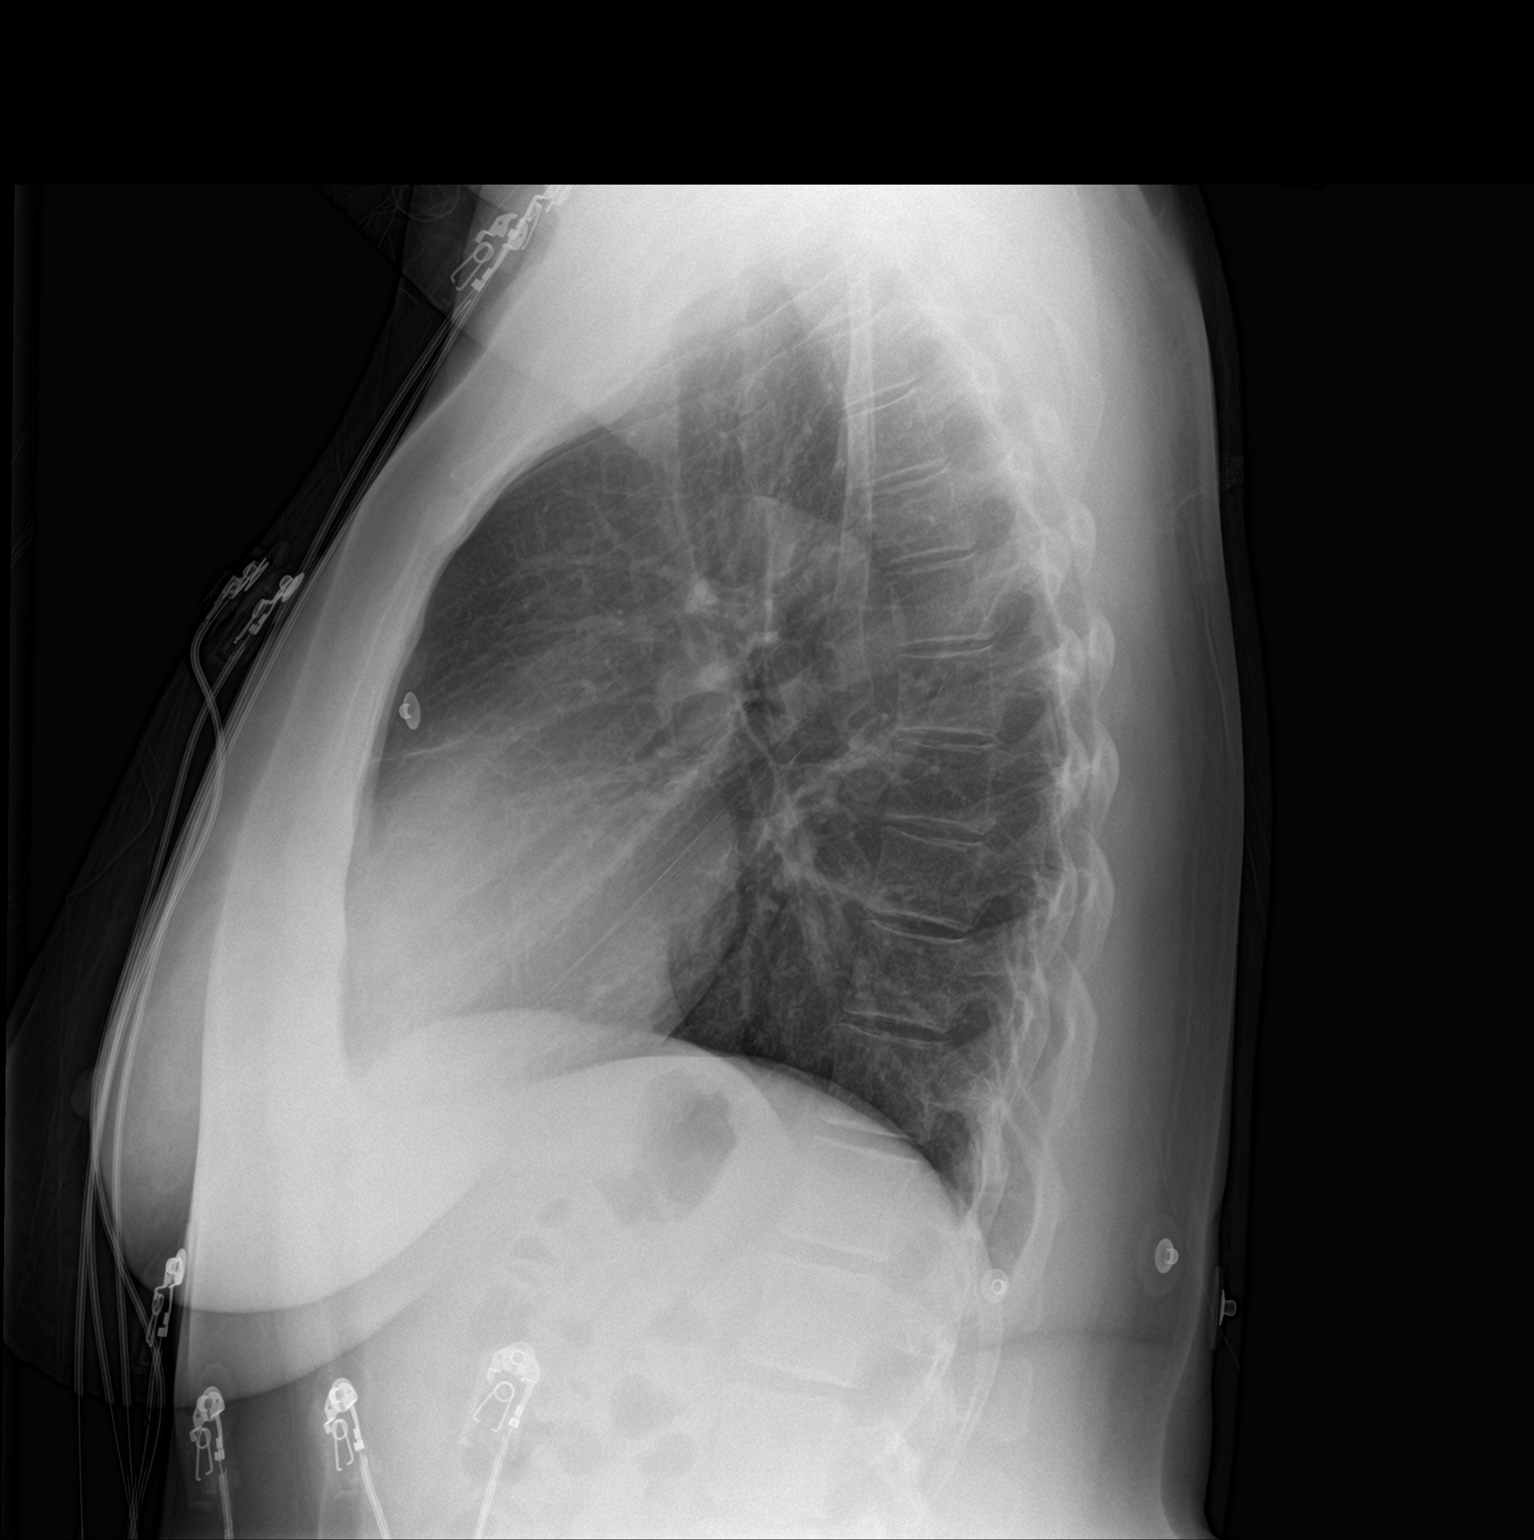

[2 of 2 positions shown; findings below may reference images not displayed]

FINDINGS: Lungs are clear. Heart size and pulmonary vascularity are normal. No
adenopathy. No pneumothorax. No bone lesions.
IMPRESSION: No edema or consolidation.

## 2017-01-26 IMAGING — CT CT ANGIO CHEST
1 of 8 series · 17 of 36 positions shown · IV contrast (Iohexol (Omnipaque 350))
Comparison: Chest x-ray earlier today.

CLINICAL DATA: Left side chest pain, dizziness.  Recent postpartum.

EXAM:
CT ANGIOGRAPHY CHEST WITH CONTRAST
TECHNIQUE: Multidetector CT imaging of the chest was performed using the
standard protocol during bolus administration of intravenous
contrast. Multiplanar CT image reconstructions and MIPs were
obtained to evaluate the vascular anatomy.
CONTRAST:  80 cc Isovue 370 IV

[Series 406: thins pacs · axial · 0.68mm/px · z∈[-185,+27]mm · 17 of 240 slices shown]
[im 14/240  lung]
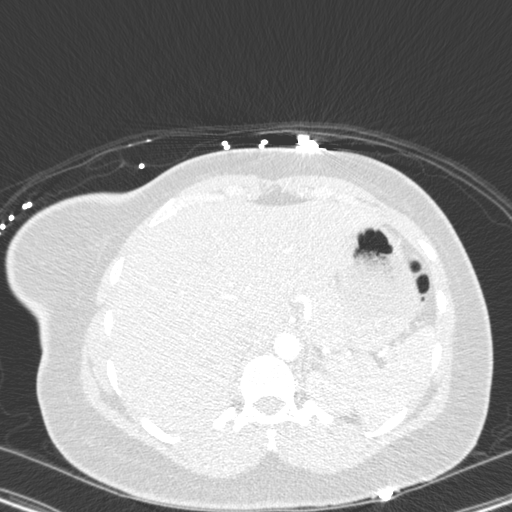
[im 27/240  mediastinal]
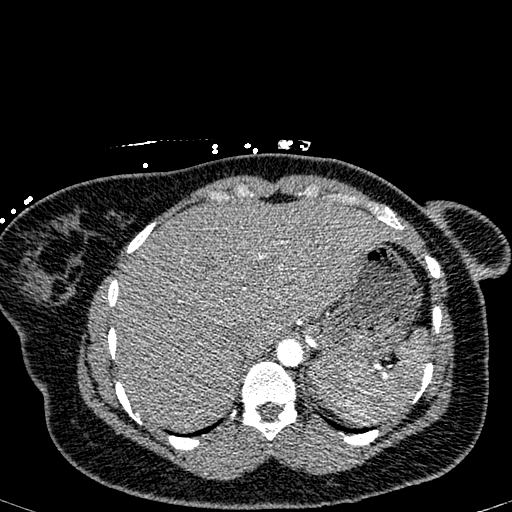
[im 40/240  lung]
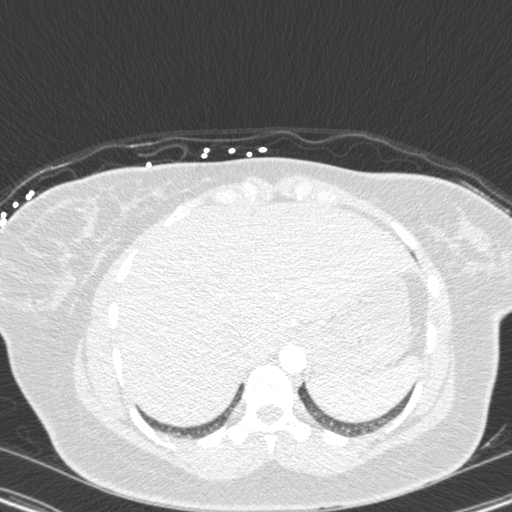
[im 54/240  mediastinal]
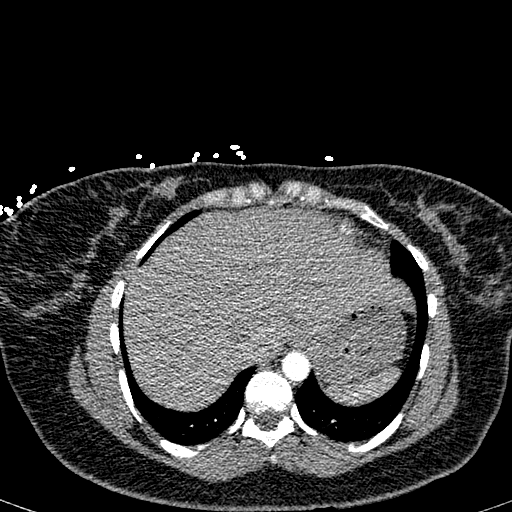
[im 67/240  lung]
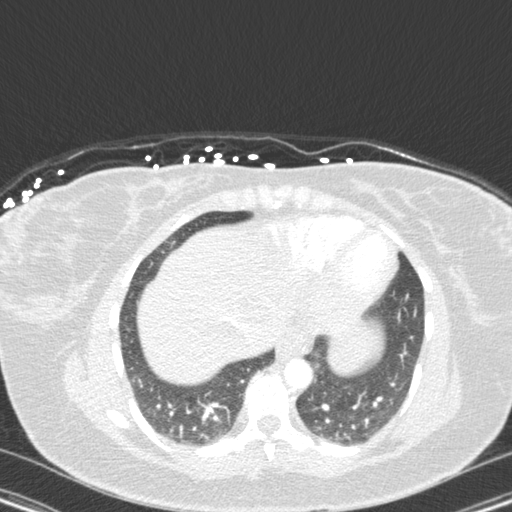
[im 80/240  mediastinal]
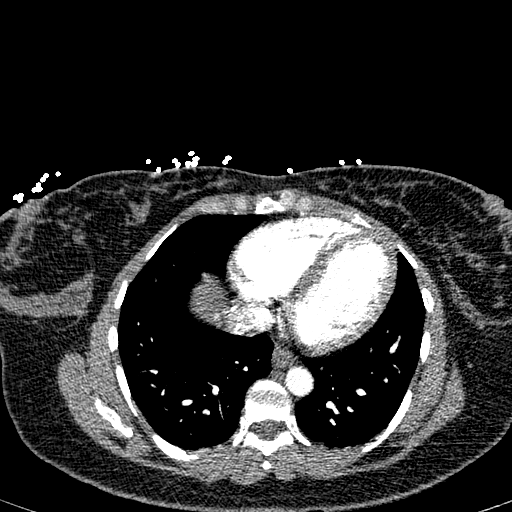
[im 93/240  lung]
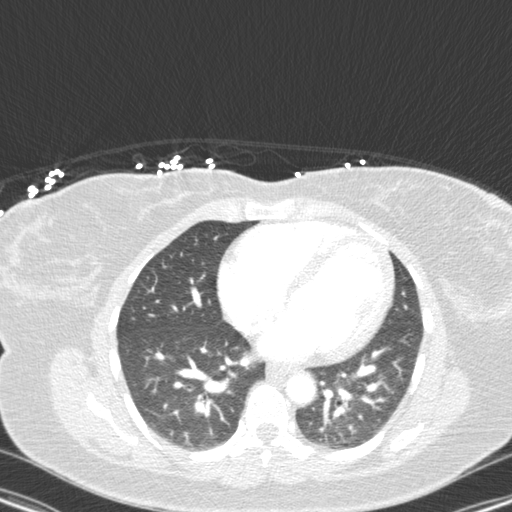
[im 107/240  mediastinal]
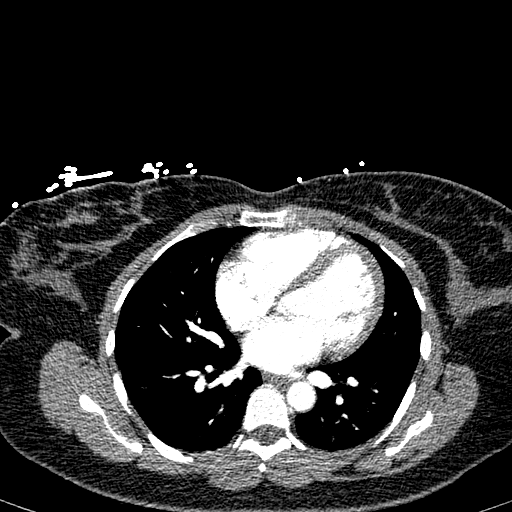
[im 120/240  lung]
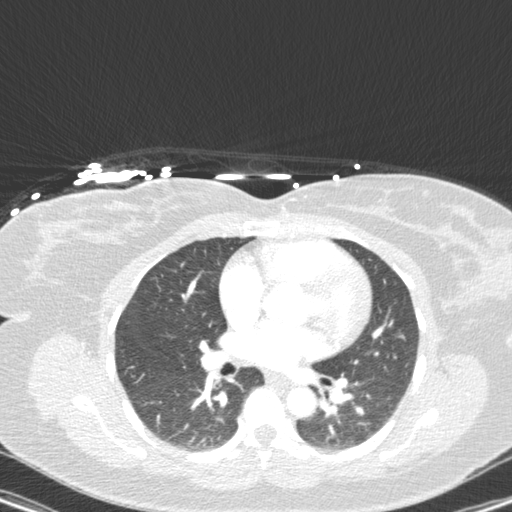
[im 133/240  mediastinal]
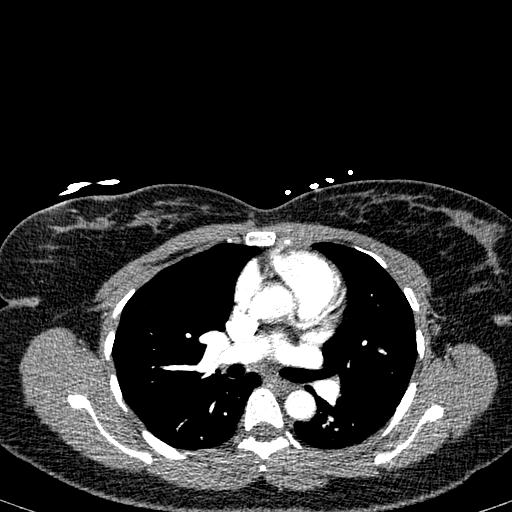
[im 147/240  lung]
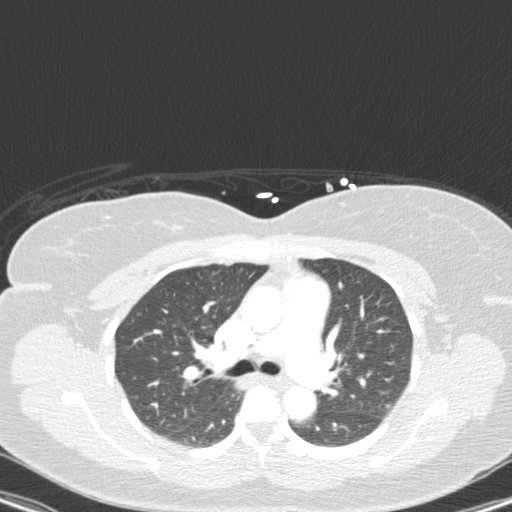
[im 160/240  mediastinal]
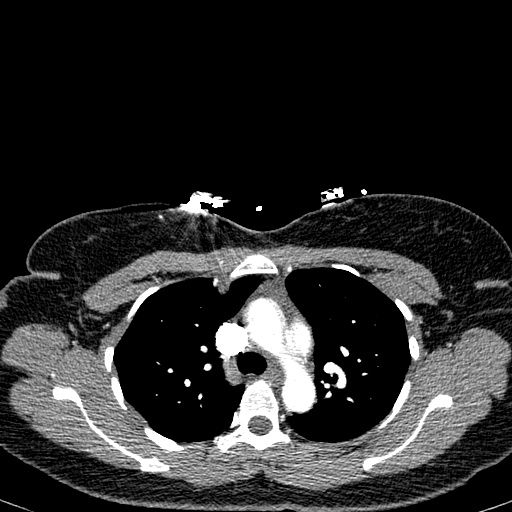
[im 173/240  lung]
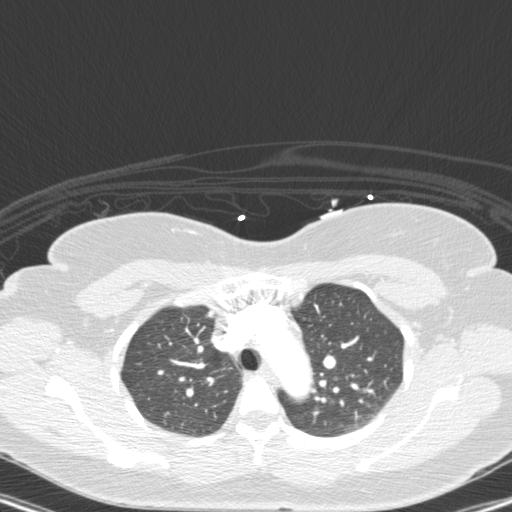
[im 186/240  mediastinal]
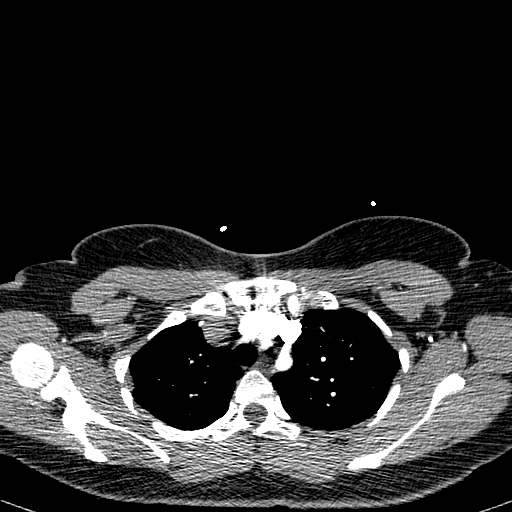
[im 200/240  lung]
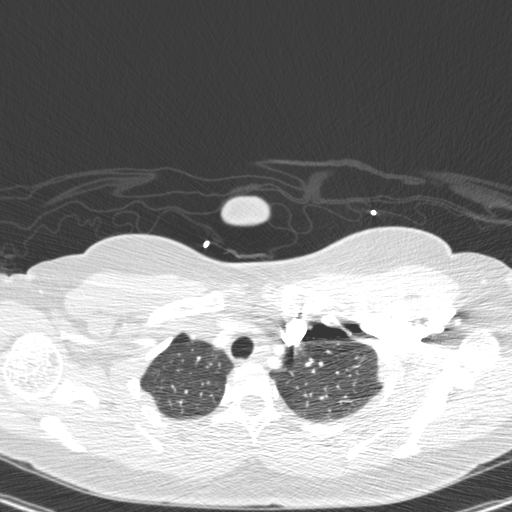
[im 213/240  mediastinal]
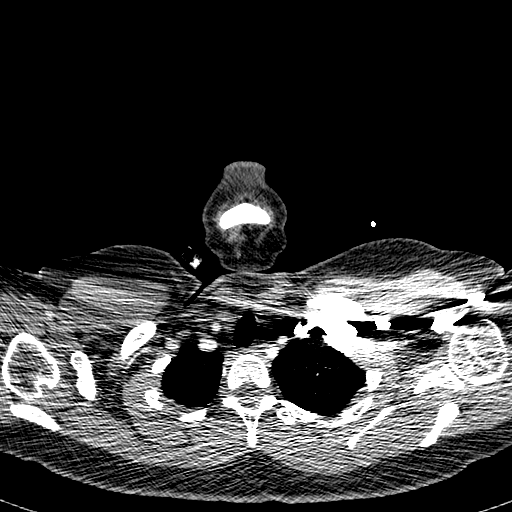
[im 226/240  lung]
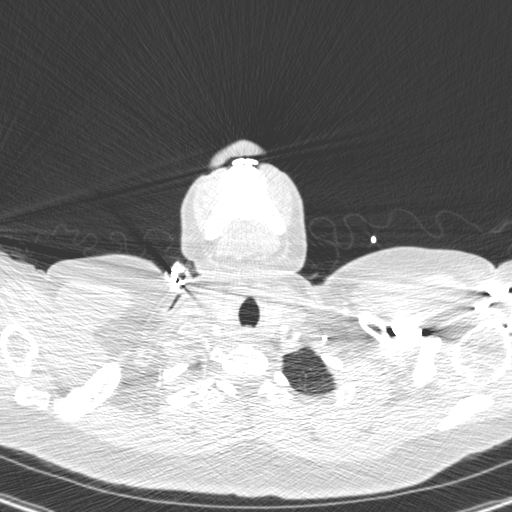

[17 of 36 positions shown; findings below may reference images not displayed]

FINDINGS: Cardiovascular: No filling defects in the pulmonary arteries to
suggest pulmonary emboli. Heart is normal size. Aorta is normal
caliber.

Mediastinum/Nodes: No mediastinal, hilar, or axillary adenopathy.

Lungs/Pleura: Lungs are clear. No focal airspace opacities or
suspicious nodules. No effusions.

Upper Abdomen: Imaging into the upper abdomen shows no acute
findings.

Musculoskeletal: Chest wall soft tissues are unremarkable. No acute
bony abnormality.

Review of the MIP images confirms the above findings.
IMPRESSION: No evidence of pulmonary embolus.

No acute cardiopulmonary disease.

## 2017-01-26 MED ORDER — IOPAMIDOL (ISOVUE-370) INJECTION 76%
INTRAVENOUS | Status: AC
  Administered 2017-01-26: 100 mL
  Filled 2017-01-26: qty 100

## 2017-01-26 NOTE — Discharge Instructions (Signed)
you will benefit from ultrasound of her heart as an outpatient. Please follow-up with cardiology, phone number as listed above. Please return without fail for worsening symptoms, including difficulty breathing, worsening pain, passing out, or any other symptoms concerning to you.

## 2017-01-26 NOTE — ED Notes (Signed)
Pt transported to xray 

## 2017-01-26 NOTE — ED Notes (Signed)
Pt departed in NAD, refused use of wheelchair.  

## 2017-01-26 NOTE — ED Triage Notes (Signed)
Patient from urgent care via [facility]. Patient c/o left sided that chest pain and back pain that started at 9:30am. Chest pain had subsided when EMS arrived. Back pain relieved after 1 mg of nitroglycerin, and 324 mg of aspirin. Patient not complaining of pain on arrival to emergency department. Patient 8 months postpartum.
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
## 2017-01-28 ENCOUNTER — Ambulatory Visit (INDEPENDENT_AMBULATORY_CARE_PROVIDER_SITE_OTHER): Payer: 59 | Admitting: Cardiovascular Disease

## 2017-01-28 VITALS — BP 110/70 | HR 86 | Ht 62.0 in | Wt 177.0 lb

## 2017-01-28 DIAGNOSIS — R079 Chest pain, unspecified: Secondary | ICD-10-CM | POA: Diagnosis not present
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
## 2017-01-28 NOTE — Patient Instructions (Addendum)
Medication Instructions:  Your physician recommends that you continue on your current medications as directed. Please refer to the Current Medication list given to you today.  Labwork: NONE  Testing/Procedures: Your physician has requested that you have an exercise tolerance test. For further information please visit [email or web address] Please also follow instruction sheet, as given.  Your physician has requested that you have an echocardiogram. Echocardiography is a painless test that uses sound waves to create images of your heart. It provides your doctor with information about the size and shape of your heart and how well your heart's chambers and valves are working. This procedure takes approximately one hour. There are no restrictions for this procedure.  Follow-Up: Your physician wants you to follow-up in: 6 months with [name]. You will receive a reminder letter in the mail two months in advance. If you don't receive a letter, please call our office to schedule the follow-up appointment.   If you need a refill on your cardiac medications before your next appointment, please call your pharmacy.

## 2017-02-10 ENCOUNTER — Encounter: Payer: Self-pay | Admitting: *Deleted

## 2017-02-10 ENCOUNTER — Ambulatory Visit (INDEPENDENT_AMBULATORY_CARE_PROVIDER_SITE_OTHER): Payer: 59

## 2017-02-10 ENCOUNTER — Ambulatory Visit (HOSPITAL_COMMUNITY): Payer: 59

## 2017-02-10 DIAGNOSIS — R079 Chest pain, unspecified: Secondary | ICD-10-CM | POA: Diagnosis not present

## 2017-02-10 LAB — EXERCISE TOLERANCE TEST
CHL RATE OF PERCEIVED EXERTION: 16
CSEPED: 8 min
CSEPHR: 78 %
Estimated workload: 10.1 METS
Exercise duration (sec): 5 s
MPHR: 183 {beats}/min
Peak HR: 144 {beats}/min
Rest HR: 78 {beats}/min

## 2017-02-11 ENCOUNTER — Telehealth: Payer: Self-pay | Admitting: Cardiovascular Disease

## 2017-02-11 NOTE — Telephone Encounter (Signed)
New Message     Pt is returning pam call for exercise test results

## 2017-02-11 NOTE — Telephone Encounter (Signed)
Patient aware of test results.

## 2017-02-11 NOTE — Telephone Encounter (Signed)
Follow Up   Pt calling back returning call regarding exercise tolerance test results. Please call.

## 2017-02-14 ENCOUNTER — Other Ambulatory Visit: Payer: Self-pay

## 2017-02-14 ENCOUNTER — Ambulatory Visit (HOSPITAL_COMMUNITY): Payer: 59 | Attending: Cardiology

## 2017-02-14 DIAGNOSIS — R079 Chest pain, unspecified: Secondary | ICD-10-CM | POA: Insufficient documentation
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
## 2017-07-11 ENCOUNTER — Ambulatory Visit: Payer: 59 | Admitting: Cardiology

## 2017-07-12 ENCOUNTER — Encounter: Payer: Self-pay | Admitting: Nurse Practitioner

## 2017-07-26 ENCOUNTER — Ambulatory Visit: Payer: 59 | Admitting: Cardiology

## 2017-08-15 ENCOUNTER — Encounter (INDEPENDENT_AMBULATORY_CARE_PROVIDER_SITE_OTHER): Payer: Self-pay

## 2017-08-15 ENCOUNTER — Ambulatory Visit (INDEPENDENT_AMBULATORY_CARE_PROVIDER_SITE_OTHER): Payer: 59 | Admitting: Nurse Practitioner

## 2017-08-15 ENCOUNTER — Encounter: Payer: Self-pay | Admitting: Nurse Practitioner

## 2017-08-15 VITALS — BP 120/80 | HR 76 | Ht 62.0 in | Wt 177.0 lb

## 2017-08-15 DIAGNOSIS — R079 Chest pain, unspecified: Secondary | ICD-10-CM
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
## 2017-08-15 NOTE — Patient Instructions (Addendum)
We will be checking the following labs today - NONE   Medication Instructions:    Continue with your current medicines.     Testing/Procedures To Be Arranged:  N/A  Follow-Up:  See us back as needed.    Other Special Instructions:   Think about what we talked about today  Here are my tips to lose weight:  1. Drink only water. You do not need milk, juice, tea, soda or diet soda.  2. Do not eat anything "white". This includes white bread, potatoes, rice or mayo  3. Stay away from fried foods and sweets  4. Your portion should be the size of the palm of your hand.  5. Know what your weaknesses are and avoid.  6. Find an exercise you like and do it every day for 45 to 60 minutes.          If you need a refill on your cardiac medications before your next appointment, please call your pharmacy.   Call the [facility] office at [phone] if you have any questions, problems or concerns.

## 2019-09-21 ENCOUNTER — Emergency Department (HOSPITAL_COMMUNITY): Payer: 59

## 2019-09-21 ENCOUNTER — Other Ambulatory Visit: Payer: Self-pay

## 2019-09-21 ENCOUNTER — Encounter (HOSPITAL_COMMUNITY): Payer: Self-pay

## 2019-09-21 ENCOUNTER — Emergency Department (HOSPITAL_COMMUNITY)
Admission: EM | Admit: 2019-09-21 | Discharge: 2019-09-21 | Disposition: A | Discharge status: Home / Self Care | Payer: 59 | Attending: Emergency Medicine | Admitting: Emergency Medicine

## 2019-09-21 DIAGNOSIS — R1032 Left lower quadrant pain: Secondary | ICD-10-CM | POA: Diagnosis present

## 2019-09-21 DIAGNOSIS — N200 Calculus of kidney: Secondary | ICD-10-CM | POA: Insufficient documentation

## 2019-09-21 DIAGNOSIS — Z793 Long term (current) use of hormonal contraceptives: Secondary | ICD-10-CM | POA: Insufficient documentation

## 2019-09-21 LAB — CBC WITH DIFFERENTIAL/PLATELET
Abs Immature Granulocytes: 0.01 10*3/uL (ref 0.00–0.07)
Basophils Absolute: 0 10*3/uL (ref 0.0–0.1)
Basophils Relative: 0 %
Eosinophils Absolute: 0 10*3/uL (ref 0.0–0.5)
Eosinophils Relative: 1 %
HCT: 43.6 % (ref 36.0–46.0)
Hemoglobin: 14 g/dL (ref 12.0–15.0)
Immature Granulocytes: 0 %
Lymphocytes Relative: 33 %
Lymphs Abs: 2.3 10*3/uL (ref 0.7–4.0)
MCH: 31.3 pg (ref 26.0–34.0)
MCHC: 32.1 g/dL (ref 30.0–36.0)
MCV: 97.3 fL (ref 80.0–100.0)
Monocytes Absolute: 0.4 10*3/uL (ref 0.1–1.0)
Monocytes Relative: 5 %
Neutro Abs: 4.3 10*3/uL (ref 1.7–7.7)
Neutrophils Relative %: 61 %
Platelets: 338 10*3/uL (ref 150–400)
RBC: 4.48 MIL/uL (ref 3.87–5.11)
RDW: 12.7 % (ref 11.5–15.5)
WBC: 7 10*3/uL (ref 4.0–10.5)
nRBC: 0 % (ref 0.0–0.2)

## 2019-09-21 LAB — URINALYSIS, ROUTINE W REFLEX MICROSCOPIC
Bilirubin Urine: NEGATIVE
Glucose, UA: NEGATIVE mg/dL
Ketones, ur: NEGATIVE mg/dL
Leukocytes,Ua: NEGATIVE
Nitrite: NEGATIVE
Protein, ur: NEGATIVE mg/dL
RBC / HPF: >50 RBC/hpf — ABNORMAL HIGH (ref 0–5)
Specific Gravity, Urine: 1.008 (ref 1.005–1.030)
pH: 6 (ref 5.0–8.0)

## 2019-09-21 LAB — COMPREHENSIVE METABOLIC PANEL
ALT: 15 U/L (ref 0–44)
AST: 20 U/L (ref 15–41)
Albumin: 3.6 g/dL (ref 3.5–5.0)
Alkaline Phosphatase: 51 U/L (ref 38–126)
Anion gap: 9 (ref 5–15)
BUN: 9 mg/dL (ref 6–20)
CO2: 25 mmol/L (ref 22–32)
Calcium: 9.1 mg/dL (ref 8.9–10.3)
Chloride: 107 mmol/L (ref 98–111)
Creatinine, Ser: 0.79 mg/dL (ref 0.44–1.00)
GFR calc Af Amer: >60 mL/min (ref >60)
GFR calc non Af Amer: >60 mL/min (ref >60)
Glucose, Bld: 94 mg/dL (ref 70–99)
Potassium: 4 mmol/L (ref 3.5–5.1)
Sodium: 141 mmol/L (ref 135–145)
Total Bilirubin: 0.6 mg/dL (ref 0.3–1.2)
Total Protein: 7.5 g/dL (ref 6.5–8.1)

## 2019-09-21 LAB — I-STAT BETA HCG BLOOD, ED (MC, WL, AP ONLY): I-stat hCG, quantitative: <5 m[IU]/mL (ref <5)

## 2019-09-21 LAB — LIPASE, BLOOD: Lipase: 34 U/L (ref 11–51)

## 2019-09-21 IMAGING — CT CT ABD-PELV W/ CM
2 of 5 series · 17 of 46 positions shown, 19 images · IV contrast (OMNIPAQUE 300)
Comparison: None.

CLINICAL DATA: Left lower abdominal pain.

EXAM:
CT ABDOMEN AND PELVIS WITH CONTRAST
TECHNIQUE: Multidetector CT imaging of the abdomen and pelvis was performed
using the standard protocol following bolus administration of
intravenous contrast.
CONTRAST:  100mL OMNIPAQUE IOHEXOL 300 MG/ML  SOLN

[Series 2: axial st · axial · 0.62mm/px · z∈[+1080,+1460]mm · 14 of 88 slices shown, 16 images]
[im 6/88  soft-tissue]
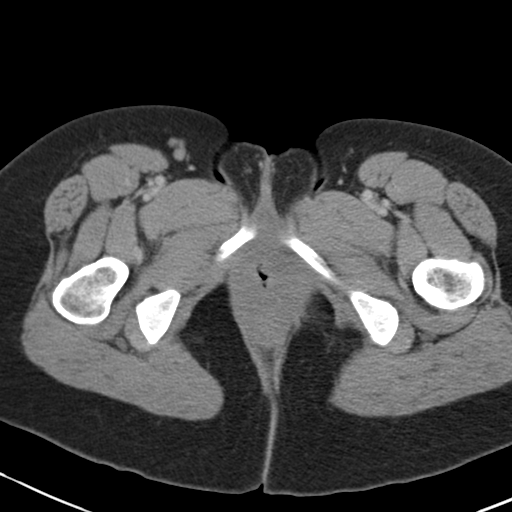
[im 6/88  bone]
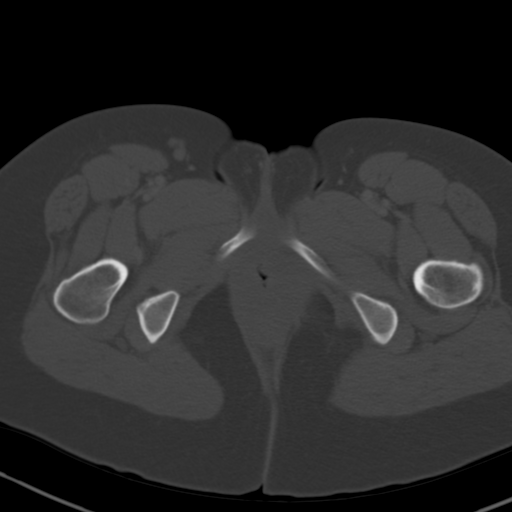
[im 12/88  soft-tissue]
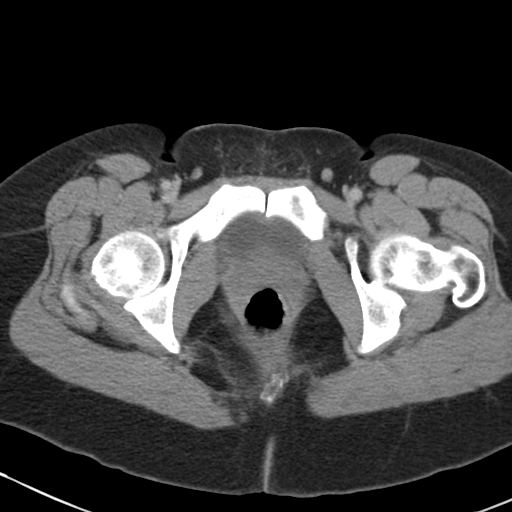
[im 18/88  soft-tissue]
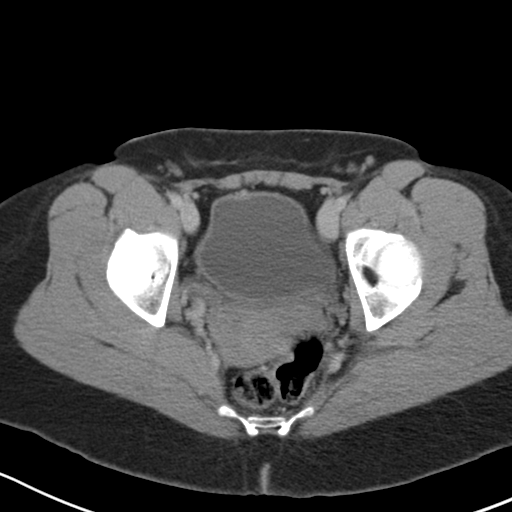
[im 24/88  soft-tissue]
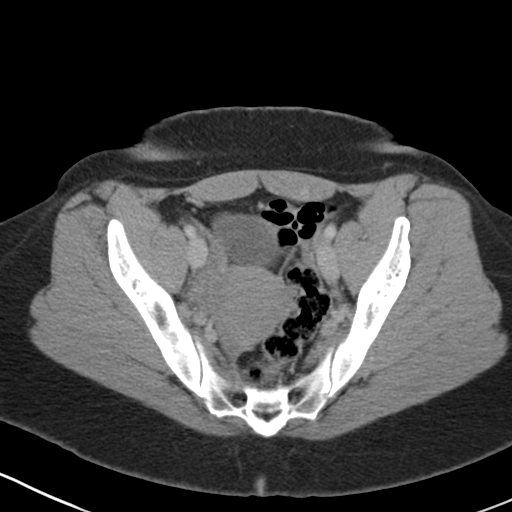
[im 30/88  soft-tissue]
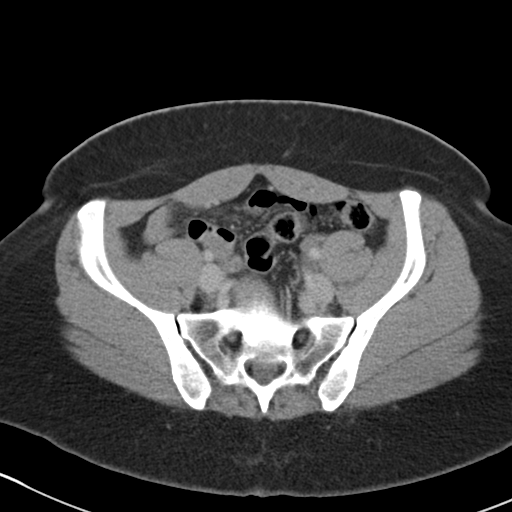
[im 35/88  soft-tissue]
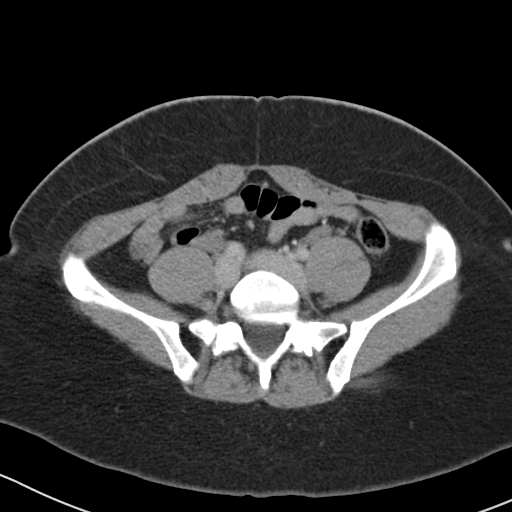
[im 41/88  soft-tissue]
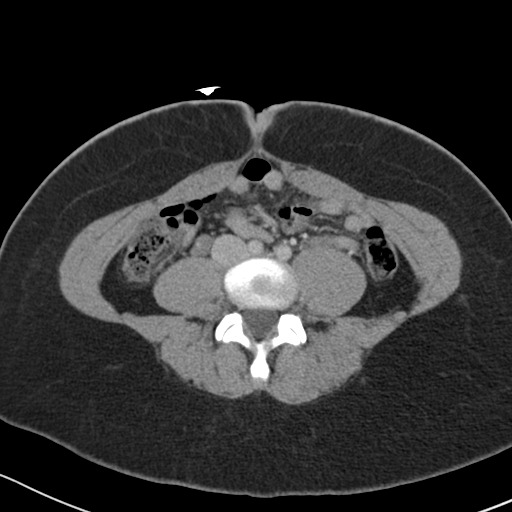
[im 47/88  soft-tissue]
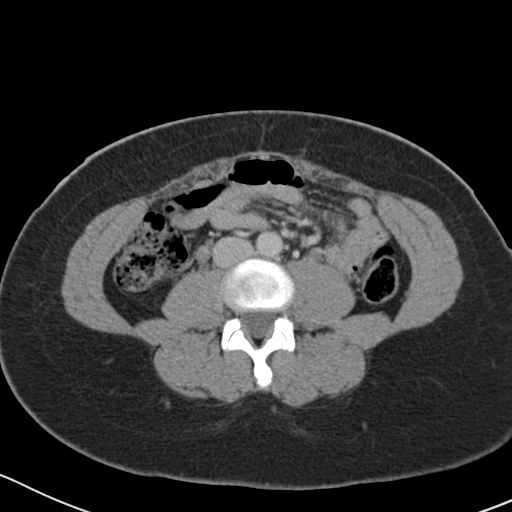
[im 53/88  soft-tissue]
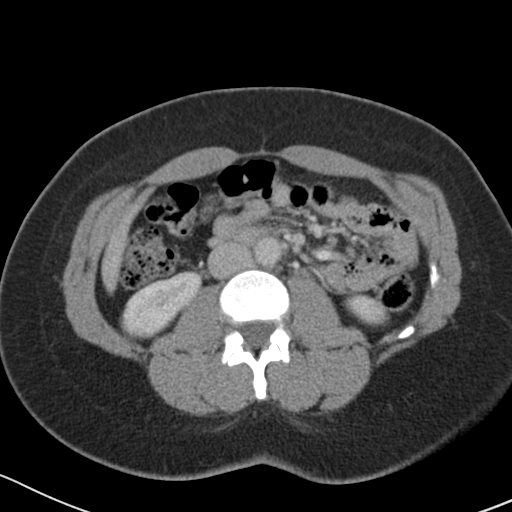
[im 53/88  bone]
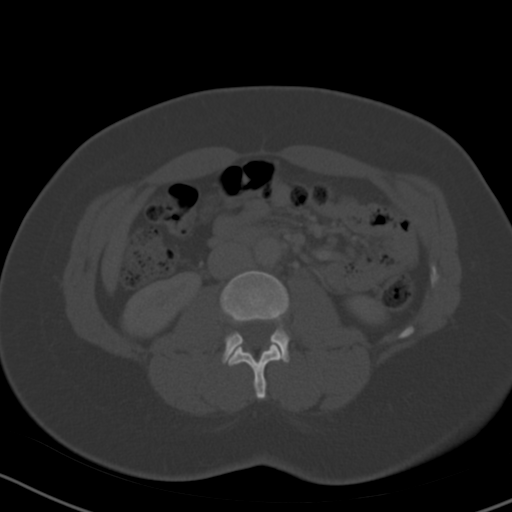
[im 59/88  soft-tissue]
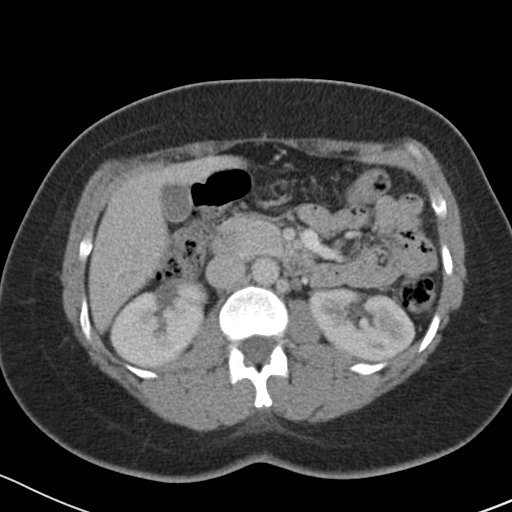
[im 64/88  soft-tissue]
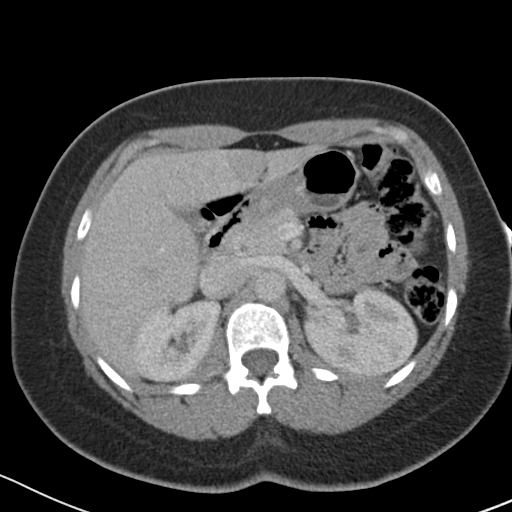
[im 70/88  soft-tissue]
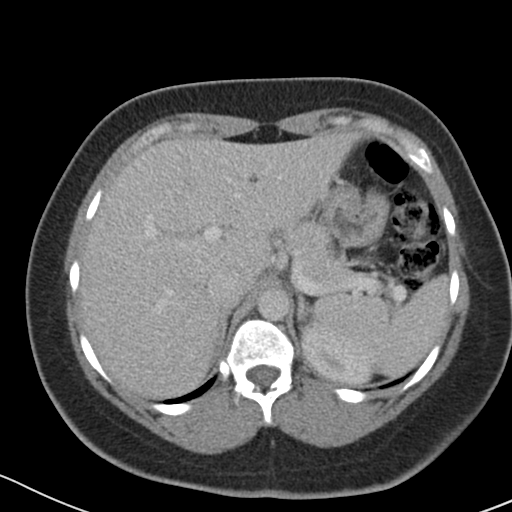
[im 76/88  soft-tissue]
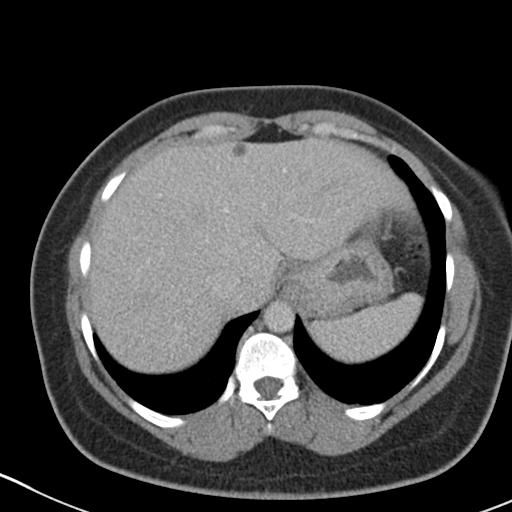
[im 82/88  soft-tissue]
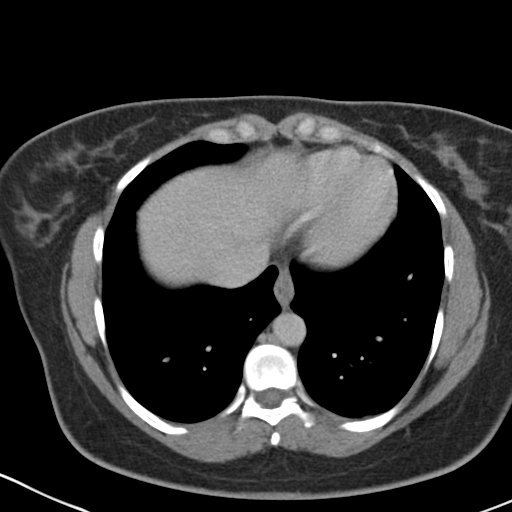

[Series 4: coronal st · coronal · 0.90mm/px · 3 of 101 slices shown]
[im 34/101  soft-tissue]
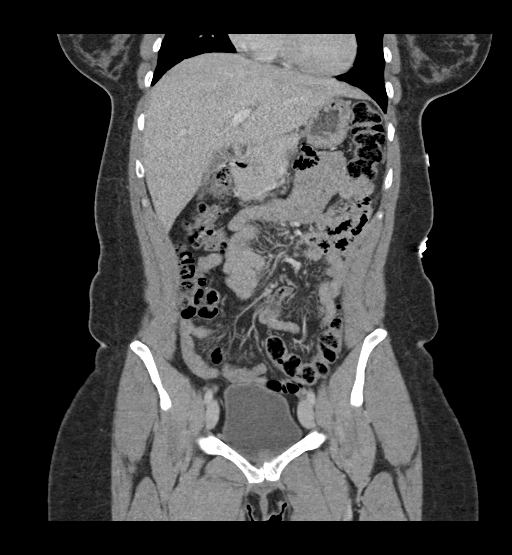
[im 45/101  soft-tissue]
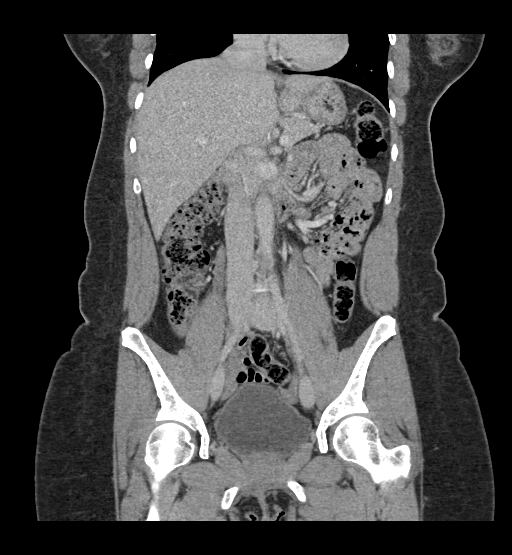
[im 56/101  soft-tissue]
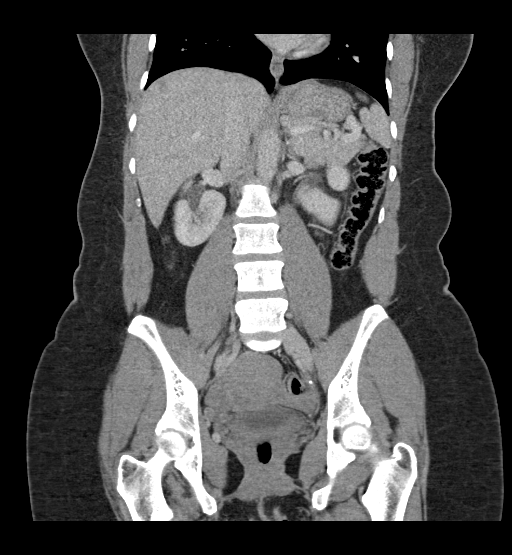

[17 of 46 positions shown; findings below may reference images not displayed]

FINDINGS: Lower chest: No acute abnormality.

Hepatobiliary: No solid hepatic mass. 7 mm cyst in the anterior left
hepatic lobe. 10 mm indeterminate hypodense right hepatic mass near
the dome of the liver (image 10/series 2. Normal gallbladder.

Pancreas: Unremarkable. No pancreatic ductal dilatation or
surrounding inflammatory changes.

Spleen: Normal in size without focal abnormality.

Adrenals/Urinary Tract: Normal adrenal glands. 4 mm calcification
along the left side of the pelvis concerning for a distal left
ureteral calculus. No other urolithiasis. No obstructive uropathy.
Normal bladder.

Stomach/Bowel: Stomach is within normal limits. Appendix appears
normal. No evidence of bowel wall thickening, distention, or
inflammatory changes.

Vascular/Lymphatic: No significant vascular findings are present. No
enlarged abdominal or pelvic lymph nodes.

Reproductive: Uterus and bilateral adnexa are unremarkable.

Other: No fluid collection or hematoma.  No abdominal wall hernia.

Musculoskeletal: No acute osseous abnormality. No aggressive osseous
lesion.
IMPRESSION: 4 mm calcification along the left side of the pelvis concerning for
a distal left ureteral calculus. No obstructive uropathy.

Indeterminate 10 mm hypodense right hepatic mass along the dome of
the liver. Recommend further evaluation with a nonemergent MRI of
the liver.

## 2019-09-21 MED ORDER — IBUPROFEN 400 MG PO TABS: 400.0000 mg | ORAL_TABLET | Freq: Three times a day (TID) | ORAL | 0 refills | Status: AC

## 2019-09-21 MED ORDER — SODIUM CHLORIDE 0.9 % IV SOLN: INTRAVENOUS | Status: DC

## 2019-09-21 MED ORDER — IOHEXOL 300 MG/ML  SOLN
100.0000 mL | Freq: Once | INTRAMUSCULAR | Status: AC | PRN
  Administered 2019-09-21: 12:00:00 100 mL via INTRAVENOUS

## 2019-09-21 MED ORDER — FENTANYL CITRATE (PF) 100 MCG/2ML IJ SOLN
25.0000 ug | Freq: Once | INTRAMUSCULAR | Status: AC
  Administered 2019-09-21: 10:00:00 25 ug via INTRAVENOUS
  Filled 2019-09-21: qty 2

## 2019-09-21 MED ORDER — KETOROLAC TROMETHAMINE 15 MG/ML IJ SOLN
15.0000 mg | Freq: Once | INTRAMUSCULAR | Status: AC
  Administered 2019-09-21: 13:00:00 15 mg via INTRAVENOUS
  Filled 2019-09-21: qty 1

## 2019-09-21 MED ORDER — ONDANSETRON HCL 4 MG/2ML IJ SOLN
4.0000 mg | Freq: Once | INTRAMUSCULAR | Status: AC
  Administered 2019-09-21: 4 mg via INTRAVENOUS
  Filled 2019-09-21: qty 2

## 2019-09-21 MED ORDER — SODIUM CHLORIDE (PF) 0.9 % IJ SOLN
INTRAMUSCULAR | Status: AC
  Filled 2019-09-21: qty 50

## 2019-09-21 MED ORDER — ONDANSETRON 4 MG PO TBDP: 4.0000 mg | ORAL_TABLET | Freq: Three times a day (TID) | ORAL | 0 refills | Status: DC | PRN

## 2019-09-21 MED ORDER — SODIUM CHLORIDE 0.9 % IV BOLUS
1000.0000 mL | Freq: Once | INTRAVENOUS | Status: AC
  Administered 2019-09-21: 10:00:00 1000 mL via INTRAVENOUS

## 2019-09-21 NOTE — ED Triage Notes (Signed)
Pt arrives [facility] from home with complaints of left lower abd pain beginning at 0600 this am.

## 2019-09-21 NOTE — Discharge Instructions (Addendum)
As discussed, today's evaluation has been generally reassuring.  On there is demonstration of a kidney stone in her CAT scan. In addition, further evaluation of a possible cyst or other finding requires MRI. This can be performed safely with your physician in the coming weeks.  Return here for concerning changes in your condition.  CT results: IMPRESSION: 4 mm calcification along the left side of the pelvis concerning for a distal left ureteral calculus. No obstructive uropathy.   Indeterminate 10 mm hypodense right hepatic mass along the dome of the liver. Recommend further evaluation with a nonemergent MRI of the liver.
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
## 2019-09-21 NOTE — ED Notes (Signed)
Patient transported to CT 

## 2019-11-29 ENCOUNTER — Ambulatory Visit: Payer: 59 | Attending: Internal Medicine

## 2019-11-29 DIAGNOSIS — Z20822 Contact with and (suspected) exposure to covid-19: Secondary | ICD-10-CM

## 2019-11-30 LAB — NOVEL CORONAVIRUS, NAA: SARS-CoV-2, NAA: NOT DETECTED

## 2020-02-26 ENCOUNTER — Other Ambulatory Visit: Payer: Self-pay | Admitting: Gastroenterology

## 2020-02-26 DIAGNOSIS — K6389 Other specified diseases of intestine: Secondary | ICD-10-CM

## 2020-02-26 DIAGNOSIS — R9389 Abnormal findings on diagnostic imaging of other specified body structures: Secondary | ICD-10-CM

## 2020-03-31 ENCOUNTER — Other Ambulatory Visit: Payer: Self-pay

## 2020-03-31 ENCOUNTER — Ambulatory Visit
Admission: RE | Admit: 2020-03-31 | Discharge: 2020-03-31 | Disposition: A | Discharge status: Home / Self Care | Payer: 59 | Source: Ambulatory Visit | Attending: Gastroenterology | Admitting: Gastroenterology

## 2020-03-31 DIAGNOSIS — K6389 Other specified diseases of intestine: Secondary | ICD-10-CM

## 2020-03-31 DIAGNOSIS — R9389 Abnormal findings on diagnostic imaging of other specified body structures: Secondary | ICD-10-CM

## 2020-03-31 IMAGING — MR MR ABDOMEN WO/W CM
12 of 17 series · 32 of 48 positions shown · IV contrast (multihance)
Comparison: CT abdomen pelvis, [DATE]

CLINICAL DATA: Follow-up indeterminate right hepatic mass,
bloating, constipation

EXAM:
MRI ABDOMEN WITHOUT AND WITH CONTRAST
TECHNIQUE: Multiplanar multisequence MR imaging of the abdomen was performed
both before and after the administration of intravenous contrast.
CONTRAST:  16mL MULTIHANCE GADOBENATE DIMEGLUMINE 529 MG/ML IV SOLN

[Series 3: T2 · coronal · 5.0mm · 1.56mm/px · 2 of 30 slices shown (1 of 3)]
[im 1/30]
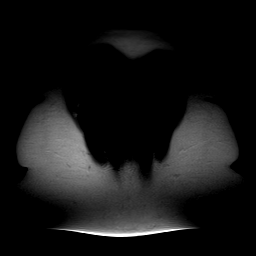
[im 30/30]
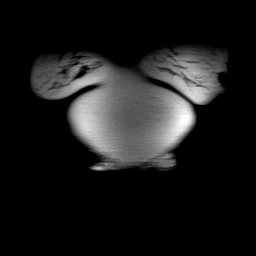

[Series 4: axial tru fisp · axial · 5.0mm · 1.41mm/px · z∈[-44,+163]mm · 3 of 37 slices shown]
[im 1/37]
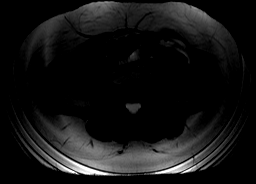
[im 19/37]
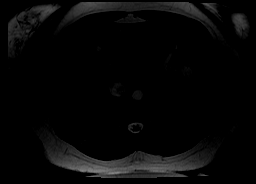
[im 37/37]
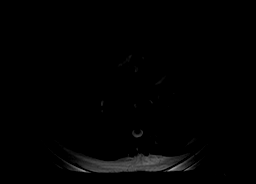

[Series 5: T2 · axial · 6.5mm · 0.74mm/px · z∈[-49,+177]mm · 2 of 30 slices shown (2 of 3)]
[im 1/30]
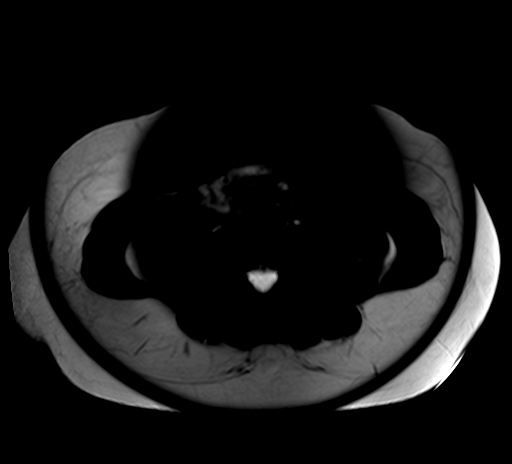
[im 30/30]
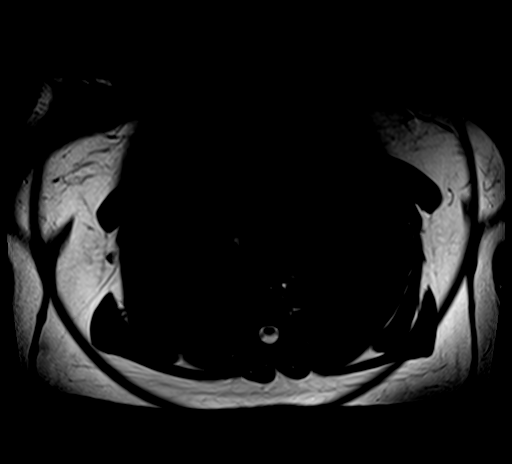

[Series 6: [id number] · axial · 6.0mm · 1.98mm/px · z∈[-52,+174]mm · 5 of 90 slices shown]
[im 1/90]
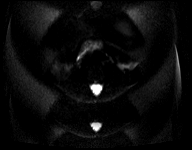
[im 23/90]
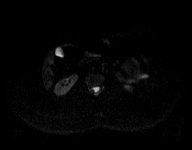
[im 45/90]
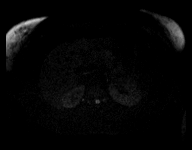
[im 67/90]
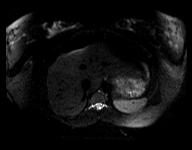
[im 90/90]
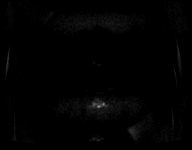

[Series 7: [id number]_adc · axial · 6.0mm · 1.98mm/px · 1 of 30 slices shown]
[im 1/30]
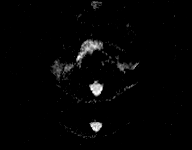

[Series 8: T2 · axial · 5.0mm · 1.41mm/px · z∈[-63,+158]mm · 2 of 35 slices shown (3 of 3)]
[im 1/35]
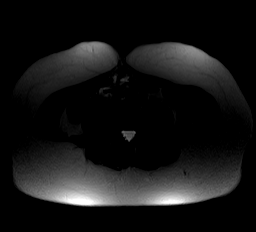
[im 35/35]
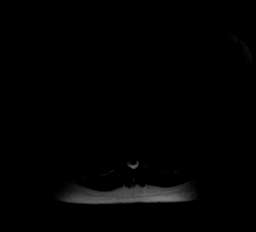

[Series 9: axial in out · axial · 6.0mm · 0.74mm/px · z∈[-53,+147]mm · 3 of 60 slices shown]
[im 1/60]
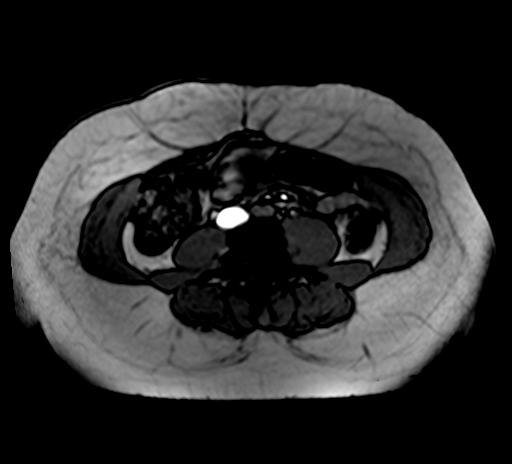
[im 30/60]
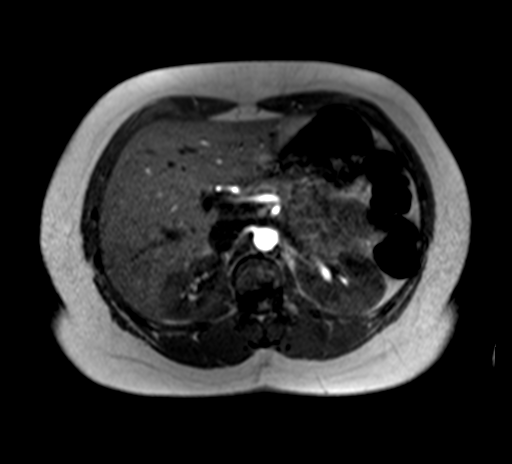
[im 60/60]
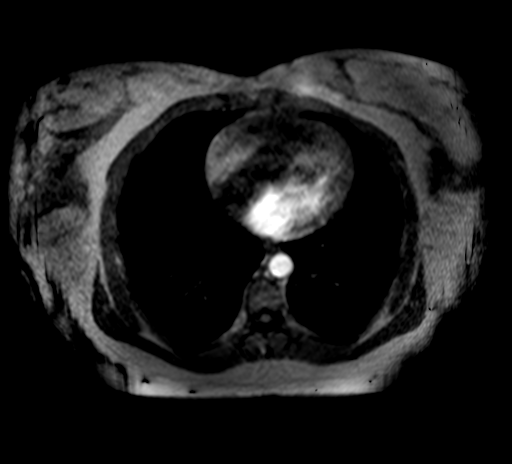

[Series 10: T1 dynamic · axial · non-contrast · 2.2mm · 0.78mm/px · z∈[-34,+140]mm · 3 of 80 slices shown]
[im 1/80]
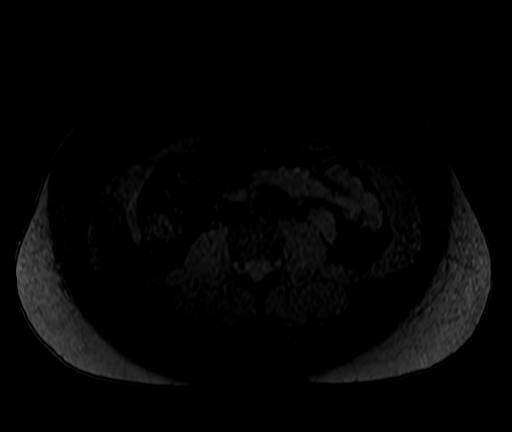
[im 40/80]
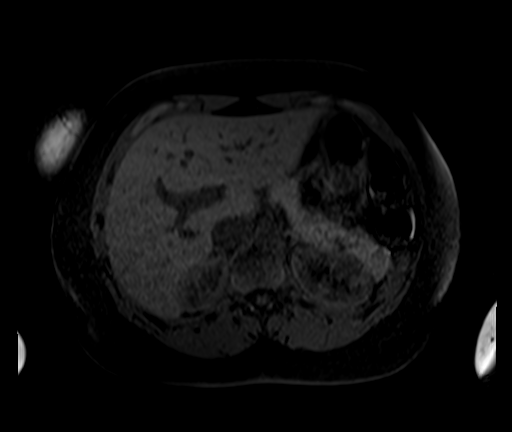
[im 80/80]
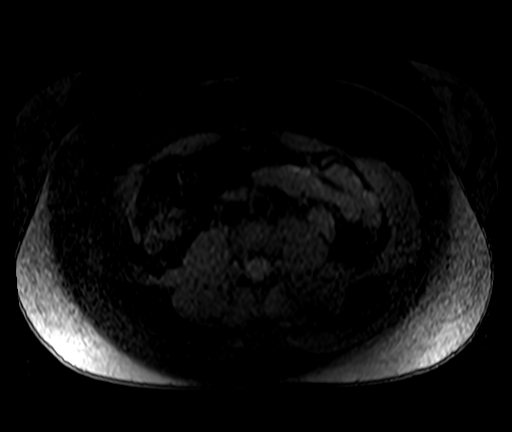

[Series 11: post 25 sec · axial · 2.2mm · 0.78mm/px · z∈[-34,+140]mm · 3 of 80 slices shown]
[im 1/80]
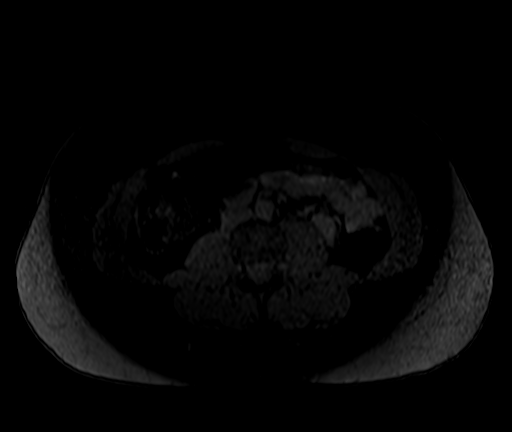
[im 40/80]
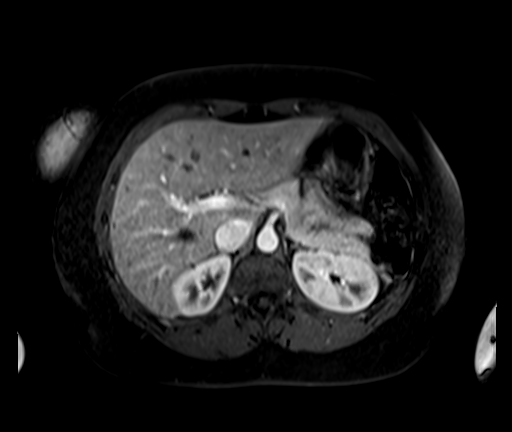
[im 80/80]
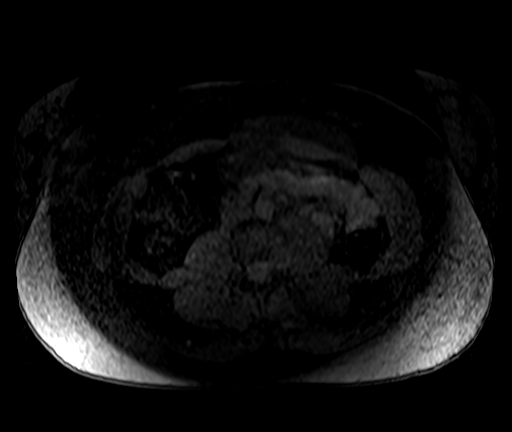

[Series 12: post 25 sec_sub · axial · 2.2mm · 0.78mm/px · z∈[-34,+140]mm · 3 of 80 slices shown]
[im 1/80]
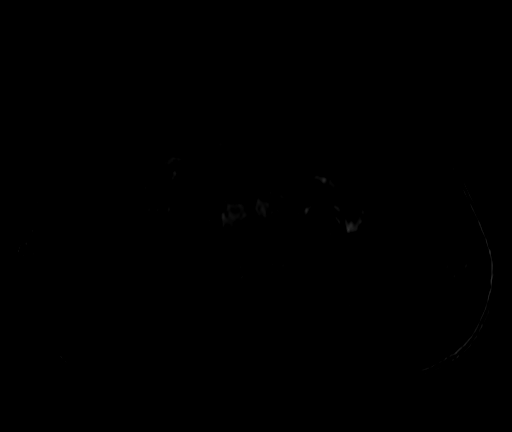
[im 40/80]
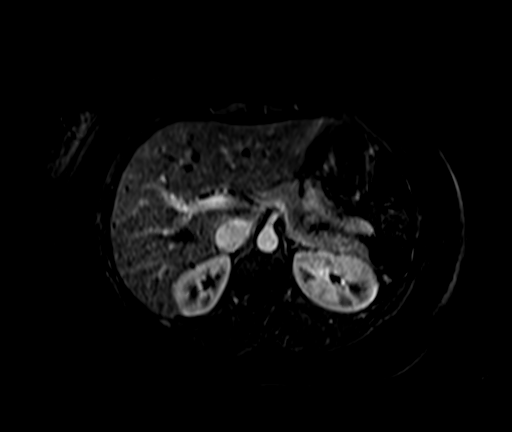
[im 80/80]
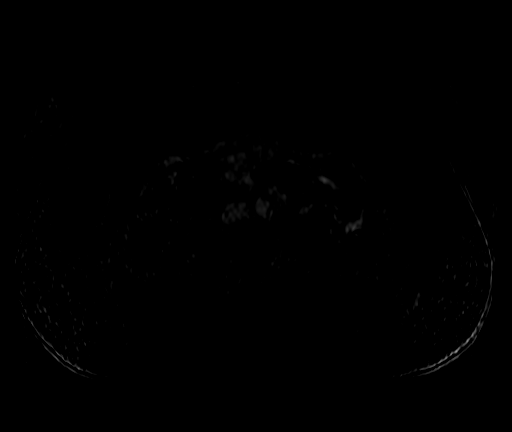

[Series 13: post 45 sec · axial · 2.2mm · 0.78mm/px · z∈[-34,+140]mm · 3 of 80 slices shown]
[im 1/80]
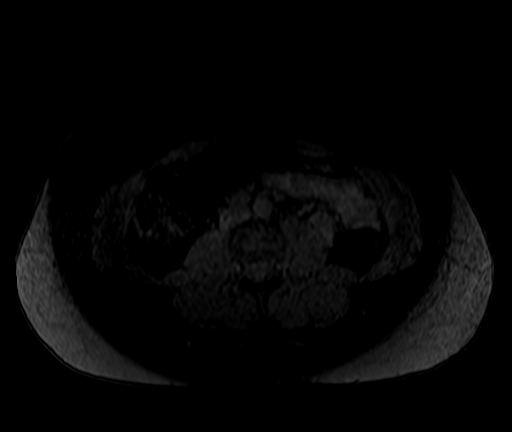
[im 40/80]
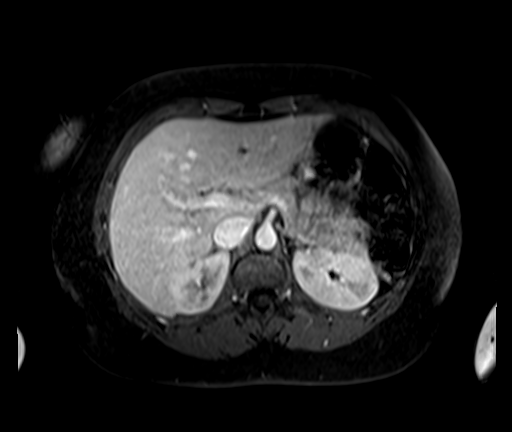
[im 80/80]
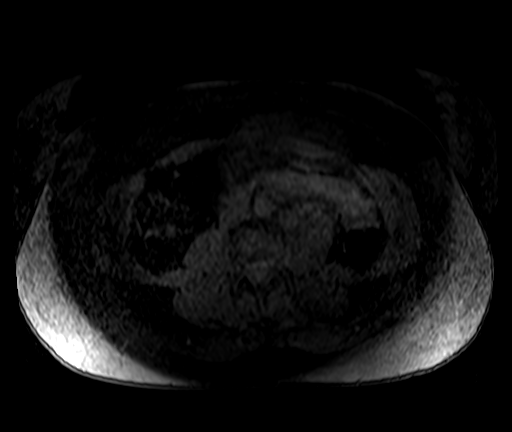

[Series 14: post 45 sec_sub · axial · 2.2mm · 0.78mm/px · z∈[-34,+52]mm · 2 of 80 slices shown]
[im 1/80]
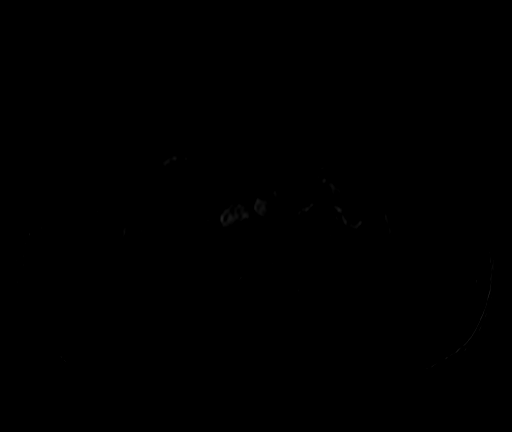
[im 40/80]
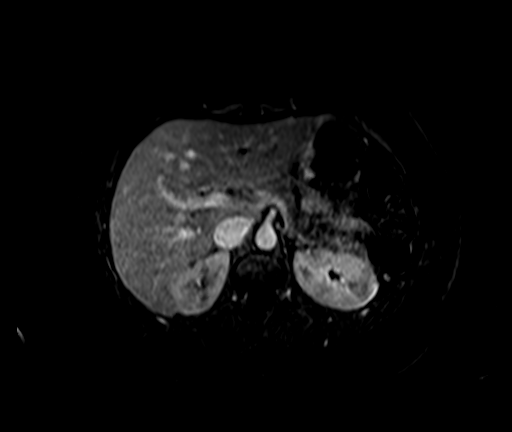

[32 of 48 positions shown; findings below may reference images not displayed]

FINDINGS: Lower chest: No acute findings.

Hepatobiliary: There is a small, intrinsically T2 hyperintense,
fluid diffusion lesion of the subcapsular lateral right liver dome
with progressive peripheral enhancement measuring 1.5 x 1.3 cm
(series 11, image 13). Additional nonenhancing simple cysts of the
anterior left lobe of the liver (series 8, image 13). No other
parenchymal abnormality identified.

Pancreas: No mass, inflammatory changes, or other parenchymal
abnormality identified.

Spleen:  Within normal limits in size and appearance.

Adrenals/Urinary Tract: No masses identified. No evidence of
hydronephrosis.

Stomach/Bowel: Visualized portions within the abdomen are
unremarkable.

Vascular/Lymphatic: No pathologically enlarged lymph nodes
identified. No abdominal aortic aneurysm demonstrated.

Other:  None.

Musculoskeletal: No suspicious bone lesions identified.
IMPRESSION: 1. There is a small, intrinsically T2 hyperintense, fluid diffusion
lesion of the subcapsular lateral right liver dome with progressive
peripheral enhancement measuring 1.5 x 1.3 cm, consistent with a
small, definitively benign hemangioma. No further follow-up or
characterization is required.
2. Additional nonenhancing simple cysts of the anterior left lobe of
the liver.

## 2020-03-31 MED ORDER — GADOBENATE DIMEGLUMINE 529 MG/ML IV SOLN
16.0000 mL | Freq: Once | INTRAVENOUS | Status: AC | PRN
  Administered 2020-03-31: 16 mL via INTRAVENOUS

## 2020-07-22 ENCOUNTER — Ambulatory Visit (INDEPENDENT_AMBULATORY_CARE_PROVIDER_SITE_OTHER): Payer: 59 | Admitting: Family Medicine

## 2020-07-22 ENCOUNTER — Other Ambulatory Visit: Payer: Self-pay

## 2020-07-22 ENCOUNTER — Encounter (INDEPENDENT_AMBULATORY_CARE_PROVIDER_SITE_OTHER): Payer: Self-pay | Admitting: Family Medicine

## 2020-07-22 VITALS — BP 113/76 | HR 76 | Temp 98.7°F | Ht 62.0 in | Wt 172.0 lb

## 2020-07-22 DIAGNOSIS — E669 Obesity, unspecified: Secondary | ICD-10-CM

## 2020-07-22 DIAGNOSIS — R0602 Shortness of breath: Secondary | ICD-10-CM

## 2020-07-22 DIAGNOSIS — Z6831 Body mass index (BMI) 31.0-31.9, adult: Secondary | ICD-10-CM

## 2020-07-22 DIAGNOSIS — E559 Vitamin D deficiency, unspecified: Secondary | ICD-10-CM

## 2020-07-22 DIAGNOSIS — R5383 Other fatigue: Secondary | ICD-10-CM | POA: Insufficient documentation

## 2020-07-22 DIAGNOSIS — Z9189 Other specified personal risk factors, not elsewhere classified: Secondary | ICD-10-CM | POA: Diagnosis not present

## 2020-07-22 DIAGNOSIS — D508 Other iron deficiency anemias: Secondary | ICD-10-CM | POA: Diagnosis not present

## 2020-07-22 DIAGNOSIS — D649 Anemia, unspecified: Secondary | ICD-10-CM | POA: Insufficient documentation

## 2020-07-22 DIAGNOSIS — Z0289 Encounter for other administrative examinations: Secondary | ICD-10-CM

## 2020-07-22 DIAGNOSIS — F3289 Other specified depressive episodes: Secondary | ICD-10-CM

## 2020-07-22 DIAGNOSIS — F418 Other specified anxiety disorders: Secondary | ICD-10-CM | POA: Insufficient documentation

## 2020-07-22 DIAGNOSIS — F32A Depression, unspecified: Secondary | ICD-10-CM | POA: Insufficient documentation

## 2020-07-24 LAB — COMPREHENSIVE METABOLIC PANEL
ALT: 15 IU/L (ref 0–32)
AST: 19 IU/L (ref 0–40)
Albumin/Globulin Ratio: 1.2 (ref 1.2–2.2)
Albumin: 4.2 g/dL (ref 3.8–4.8)
Alkaline Phosphatase: 64 IU/L (ref 44–121)
BUN/Creatinine Ratio: 8 — ABNORMAL LOW (ref 9–23)
BUN: 6 mg/dL (ref 6–24)
Bilirubin Total: 0.3 mg/dL (ref 0.0–1.2)
CO2: 20 mmol/L (ref 20–29)
Calcium: 9.9 mg/dL (ref 8.7–10.2)
Chloride: 102 mmol/L (ref 96–106)
Creatinine, Ser: 0.75 mg/dL (ref 0.57–1.00)
GFR calc Af Amer: 115 mL/min/{1.73_m2} (ref >59)
GFR calc non Af Amer: 100 mL/min/{1.73_m2} (ref >59)
Globulin, Total: 3.5 g/dL (ref 1.5–4.5)
Glucose: 81 mg/dL (ref 65–99)
Potassium: 4.8 mmol/L (ref 3.5–5.2)
Sodium: 136 mmol/L (ref 134–144)
Total Protein: 7.7 g/dL (ref 6.0–8.5)

## 2020-07-24 LAB — CBC WITH DIFFERENTIAL/PLATELET
Basophils Absolute: 0 10*3/uL (ref 0.0–0.2)
Basos: 0 %
EOS (ABSOLUTE): 0.1 10*3/uL (ref 0.0–0.4)
Eos: 1 %
Hematocrit: 42.9 % (ref 34.0–46.6)
Hemoglobin: 14.4 g/dL (ref 11.1–15.9)
Immature Grans (Abs): 0 10*3/uL (ref 0.0–0.1)
Immature Granulocytes: 0 %
Lymphocytes Absolute: 2.6 10*3/uL (ref 0.7–3.1)
Lymphs: 36 %
MCH: 29.9 pg (ref 26.6–33.0)
MCHC: 33.6 g/dL (ref 31.5–35.7)
MCV: 89 fL (ref 79–97)
Monocytes Absolute: 0.4 10*3/uL (ref 0.1–0.9)
Monocytes: 5 %
Neutrophils Absolute: 4.1 10*3/uL (ref 1.4–7.0)
Neutrophils: 58 %
Platelets: 409 10*3/uL (ref 150–450)
RBC: 4.82 x10E6/uL (ref 3.77–5.28)
RDW: 12.1 % (ref 11.7–15.4)
WBC: 7.2 10*3/uL (ref 3.4–10.8)

## 2020-07-24 LAB — IRON,TIBC AND FERRITIN PANEL
Ferritin: 70 ng/mL (ref 15–150)
Iron Saturation: 21 % (ref 15–55)
Iron: 77 ug/dL (ref 27–159)
Total Iron Binding Capacity: 367 ug/dL (ref 250–450)
UIBC: 290 ug/dL (ref 131–425)

## 2020-07-24 LAB — T4: T4, Total: 11.2 ug/dL (ref 4.5–12.0)

## 2020-07-24 LAB — VITAMIN B12: Vitamin B-12: 593 pg/mL (ref 232–1245)

## 2020-07-24 LAB — LIPID PANEL
Chol/HDL Ratio: 2.9 ratio (ref 0.0–4.4)
Cholesterol, Total: 225 mg/dL — ABNORMAL HIGH (ref 100–199)
HDL: 78 mg/dL (ref >39)
LDL Chol Calc (NIH): 128 mg/dL — ABNORMAL HIGH (ref 0–99)
Triglycerides: 109 mg/dL (ref 0–149)
VLDL Cholesterol Cal: 19 mg/dL (ref 5–40)

## 2020-07-24 LAB — TSH: TSH: 1.45 u[IU]/mL (ref 0.450–4.500)

## 2020-07-24 LAB — FOLATE: Folate: 17.7 ng/mL (ref >3.0)

## 2020-07-24 LAB — T3: T3, Total: 137 ng/dL (ref 71–180)

## 2020-07-24 LAB — VITAMIN D 25 HYDROXY (VIT D DEFICIENCY, FRACTURES): Vit D, 25-Hydroxy: 49.1 ng/mL (ref 30.0–100.0)

## 2020-07-24 LAB — INSULIN, RANDOM: INSULIN: 8.3 u[IU]/mL (ref 2.6–24.9)

## 2020-07-24 LAB — HEMOGLOBIN A1C
Est. average glucose Bld gHb Est-mCnc: 108 mg/dL
Hgb A1c MFr Bld: 5.4 % (ref 4.8–5.6)

## 2020-07-29 NOTE — Progress Notes (Signed)
Dear Dr. Rivard,   Thank you for referring [name] [name] to our clinic. The following note includes my evaluation and treatment recommendatEstanislado [name].  Chief Complaint:   OBESITY [name] [name] (MR# [id number]) is a 39 y.o. female who presents for evaluation and treatment of obesity and related comorbidities. Current BMI is Body mass index is 31.46 kg/m. [name] [name] has been struggling with her weight for many years and has been unsuccessful in either losing weight, maintaining weight loss, or reaching her healthy weight goal.  [name] [name] is currently in the action stage of change and ready to dedicate time achieving and maintaining a healthier weight. [name] [name] is interested in becoming our patient and working on intensive lifestyle modifications including (but not limited to) diet and exercise for weight loss.  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  She lives with her husband, [name] [name], and her mother, [name] [name] ([id number]), and her two sons, ages 6514 and 814.  She craves sweet and salty foods such as chips and cheese.  She dislikes eggs and mild.  She says that eating out is her worst habit.  She drinks caloric beverages.  Skips dinner.  [name] [name]'s habits were reviewed today and are as follows: Her family eats meals together, she thinks her family will eat healthier with her, her desired weight loss is 29 pounds, she started gaining weight at age 89, her heaviest weight ever was 179 pounds, she is a picky eater and doesn't like to eat healthier foods, she craves salty and sweet foods, she skips dinner frequently, she is frequently drinking liquids with calories, she has problems with excessive hunger and she struggles with emotional eating.  This is the patient's first visit at [facility].  The patient's NEW PATIENT PACKET that they filled out prior to today's office visit was reviewed at length and information from that paperwork was included within the following office visit  note.    Included in the packet: current and past health history, medications, allergies, ROS, gynecologic history (women only), surgical history, family history, social history, weight history, weight loss surgery history (for those that have had weight loss surgery), nutritional evaluation, mood and food questionnaire along with a depression screening (PHQ9) on all patients, an Epworth questionnaire, sleep habits questionnaire, patient life and health improvement goals questionnaire. These will all be scanned into the patient's chart under media.   During the visit, I independently reviewed the patient's EKG, bioimpedance scale results, and indirect calorimeter results. I used this information to tailor a meal plan for the patient that will help [name] [name] to lose weight and will improve her obesity-related conditions going forward.  I performed a medically necessary appropriate examination and/or evaluation. I discussed the assessment and treatment plan with the patient. The patient was provided an opportunity to ask questions and all were answered. The patient agreed with the plan and demonstrated an understanding of the instructions. Labs were ordered today (unless patient declined them) and will be reviewed with the patient at our next visit unless more critical results need to be addressed immediately. Clinical information was updated and documented in the EMR.  Time spent on visit including pre-visit chart review and post-visit care was estimated to be 60-74 minutes.  A separate 15 minutes was spent on risk counseling (see above/below).   Depression Screen [name] [name]'s Food and Mood (modified PHQ-9) score was 22.  Depression screen PHQ 2/9 07/22/2020  Decreased Interest 3  Down, Depressed, Hopeless 2  PHQ -  2 Score 5  Altered sleeping 2  Tired, decreased energy 2  Change in appetite 2  Feeling bad or failure about yourself  3  Trouble concentrating 3  Moving slowly or fidgety/restless 3    Suicidal thoughts 2  PHQ-9 Score 22  Difficult doing work/chores Somewhat difficult   Assessment/Plan:   1. Other fatigue [name] [name] denies daytime somnolence and denies waking up still tired. Patent has a history of symptoms of snoring. [name] [name] generally gets 8 hours of sleep per night, and states that she has generally restful sleep. Snoring is present. Apneic episodes are not present. Epworth Sleepiness Score is 4.  [name] [name] does feel that her weight is causing her energy to be lower than it should be. Fatigue may be related to obesity, depression or many other causes. Labs will be ordered, and in the meanwhile, [name] [name] will focus on self care including making healthy food choices, increasing physical activity and focusing on stress reduction.  - EKG 12-Lead - Vitamin B12 - Comprehensive metabolic panel - Folate - Hemoglobin A1c - Insulin, random - Lipid panel - T3 - T4 - TSH  2. SOB (shortness of breath) on exertion [name] [name] notes increasing shortness of breath with exercising and seems to be worsening over time with weight gain. She notes getting out of breath sooner with activity than she used to. This has not gotten worse recently. [name] [name] denies shortness of breath at rest or orthopnea.  [name] [name] does not feel that she gets out of breath more easily that she used to when she exercises. [name] [name]'s shortness of breath appears to be obesity related and exercise induced. She has agreed to work on weight loss and gradually increase exercise to treat her exercise induced shortness of breath. Will continue to monitor closely.  3. Vitamin D deficiency [name] [name] has a history of Vitamin D deficiency with resultant generalized fatigue as her primary symptom.  she is taking oral vitamin D OTC for this deficiency and tolerating it well without side-effect.    Plan:   - Discussed importance of vitamin D (as well as calcium) to their health and well-being.   - We reviewed possible symptoms of  low Vitamin D including low energy, depressed mood, muscle aches, joint aches, osteoporosis etc.  - We discussed that low Vitamin D levels may be linked to an increased risk of cardiovascular events and even increased risk of cancers- such as colon and breast.   - Educated pt that weight loss will likely improve availability of vitamin D, thus encouraged Ruben to continue with meal plan and their weight loss efforts to further improve this condition  -  Will check vitamin D level today.      - Informed patient this may be a lifelong thing, and she was encouraged to continue to take the medicine until pt told otherwise.   We will need to monitor levels regularly ( q 3-4 mo on average )  to keep levels within normal limits.   - All pt's questions and concerns regarding this condition addressed  - VITAMIN D 25 Hydroxy (Vit-D Deficiency, Fractures)  4. Other iron deficiency anemia Cailynn is not a vegetarian.  She does not have a history of weight loss surgery.  She is not currently taking an iron supplement.  Plan:  Check labs today.  CBC Latest Ref Rng & Units 07/22/2020 09/21/2019 01/26/2017  WBC 3.4 - 10.8 x10E3/uL 7.2 7.0 6.6  Hemoglobin 11.1 - 15.9 g/dL 57.3 22.0 25.4  Hematocrit 34.0 -  46.6 % 42.9 43.6 38.9  Platelets 150 - 450 x10E3/uL 409 338 334   Lab Results  Component Value Date   IRON 77 07/22/2020   TIBC 367 07/22/2020   FERRITIN 70 07/22/2020   Lab Results  Component Value Date   VITAMINB12 593 07/22/2020   - Vitamin B12 - CBC with Differential/Platelet - Folate - Fe+TIBC+Fer  5. Other depression with emotional eating With anxiety.  PHQ-9 is 22.  Denies SI.    -[name] [name] used to be on Klonopin for anxiety in the remote past.    -She last went to therapy in August 2021 and has gone once weekly.  She is progressing through therapy with [name] [name].  Over the last 2 weeks, she says she is the happiest that has been in a long time.  Her mother had a stroke in November  2020, and has lived with her essentially since then.    Plan:  She does not feel she needs medication, many of her positive answers are form being fatigued / tired and not from depressed mood per pt.  Continue counseling with your therapist.  6. At risk for deficient intake of food Demaris was given approximately 15 minutes of deficit intake of food prevention counseling today. [name] [name] is at risk for eating too few calories based on current food recall. She was encouraged to focus on meeting caloric and protein goals according to her recommended meal plan.   7. Class 1 obesity with serious comorbidity and body mass index (BMI) of 31.0 to 31.9 in adult, unspecified obesity type  Astra is currently in the action stage of change and her goal is to continue with weight loss efforts. I recommend Rakeb begin the structured treatment plan as follows:  She has agreed to the Category 1 Plan.  Exercise goals: As is.   Behavioral modification strategies: meal planning and cooking strategies, keeping healthy foods in the home and planning for success.  She was informed of the importance of frequent follow-up visits to maximize her success with intensive lifestyle modifications for her multiple health conditions. She was informed we would discuss her lab results at her next visit unless there is a critical issue that needs to be addressed sooner. Iriana agreed to keep her next visit at the agreed upon time to discuss these results.  Objective:   Blood pressure 113/76, pulse 76, temperature 98.7 F (37.1 C), height 5\' 2"  (1.575 m), weight 172 lb (78 kg), SpO2 100 %. Body mass index is 31.46 kg/m.  EKG: Normal sinus rhythm, rate 81 bpm.  Indirect Calorimeter completed today shows a VO2 of 235 and a REE of 1638.  Her calculated basal metabolic rate is thus her basal metabolic rate is better than expected.  General: Cooperative, alert, well developed, in no acute distress. HEENT: Conjunctivae and  lids unremarkable. Cardiovascular: Regular rhythm.  Lungs: Normal work of breathing. Neurologic: No focal deficits.   Lab Results  Component Value Date   CREATININE 0.75 07/22/2020   BUN 6 07/22/2020   NA 136 07/22/2020   K 4.8 07/22/2020   CL 102 07/22/2020   CO2 20 07/22/2020   Lab Results  Component Value Date   ALT 15 07/22/2020   AST 19 07/22/2020   ALKPHOS 64 07/22/2020   BILITOT 0.3 07/22/2020   Lab Results  Component Value Date   HGBA1C 5.4 07/22/2020   Lab Results  Component Value Date   INSULIN 8.3 07/22/2020   Lab Results  Component Value Date  TSH 1.450 07/22/2020   Lab Results  Component Value Date   CHOL 225 (H) 07/22/2020   HDL 78 07/22/2020   LDLCALC 128 (H) 07/22/2020   TRIG 109 07/22/2020   CHOLHDL 2.9 07/22/2020   Lab Results  Component Value Date   WBC 7.2 07/22/2020   HGB 14.4 07/22/2020   HCT 42.9 07/22/2020   MCV 89 07/22/2020   PLT 409 07/22/2020   Lab Results  Component Value Date   IRON 77 07/22/2020   TIBC 367 07/22/2020   FERRITIN 70 07/22/2020   Attestation Statements:   Reviewed by clinician on day of visit: allergies, medications, problem list, medical history, surgical history, family history, social history, and previous encounter notes.  I, Insurance claims handler, CMA, am acting as Energy manager for [employer], DO.  I have reviewed the above documentation for accuracy and completeness, and I agree with the above. Carlye Grippe, D.O.  The 21st Century Cures Act was signed into law in 2016 which includes the topic of electronic health records.  This provides immediate access to information in MyChart.  This includes consultation notes, operative notes, office notes, lab results and pathology reports.  If you have any questions about what you read please let us know at your next visit so we can discuss your concerns and take corrective action if need be.  We are right here with you.

## 2020-08-05 ENCOUNTER — Ambulatory Visit (INDEPENDENT_AMBULATORY_CARE_PROVIDER_SITE_OTHER): Payer: 59 | Admitting: Family Medicine

## 2020-08-05 ENCOUNTER — Encounter (INDEPENDENT_AMBULATORY_CARE_PROVIDER_SITE_OTHER): Payer: Self-pay | Admitting: Family Medicine

## 2020-08-05 ENCOUNTER — Other Ambulatory Visit: Payer: Self-pay

## 2020-08-05 VITALS — BP 115/74 | HR 88 | Temp 97.4°F | Ht 62.0 in | Wt 173.0 lb

## 2020-08-05 DIAGNOSIS — E8881 Metabolic syndrome: Secondary | ICD-10-CM

## 2020-08-05 DIAGNOSIS — E559 Vitamin D deficiency, unspecified: Secondary | ICD-10-CM

## 2020-08-05 DIAGNOSIS — E88819 Insulin resistance, unspecified: Secondary | ICD-10-CM | POA: Insufficient documentation

## 2020-08-05 DIAGNOSIS — D508 Other iron deficiency anemias: Secondary | ICD-10-CM | POA: Diagnosis not present

## 2020-08-05 DIAGNOSIS — Z9189 Other specified personal risk factors, not elsewhere classified: Secondary | ICD-10-CM | POA: Diagnosis not present

## 2020-08-05 DIAGNOSIS — E669 Obesity, unspecified: Secondary | ICD-10-CM | POA: Diagnosis not present

## 2020-08-05 DIAGNOSIS — Z6830 Body mass index (BMI) 30.0-30.9, adult: Secondary | ICD-10-CM

## 2020-08-05 MED ORDER — FERROUS SULFATE 325 (65 FE) MG PO TBEC: 325.0000 mg | DELAYED_RELEASE_TABLET | Freq: Every day | ORAL | 0 refills | Status: AC

## 2020-08-05 MED ORDER — VITAMIN D (ERGOCALCIFEROL) 1.25 MG (50000 UNIT) PO CAPS: 50000.0000 [IU] | ORAL_CAPSULE | ORAL | 0 refills | Status: DC

## 2020-08-06 NOTE — Progress Notes (Signed)
Chief Complaint:   OBESITY [name] [name] is here to discuss her progress with her obesity treatment plan along with follow-up of her obesity related diagnoses. [name] [name] is on the Category 1 Plan and states she is following her eating plan approximately 80% of the time. [name] [name] states she is exercising for 0 minutes 0 times per week.  Today's visit was #: 2 Starting weight: 172 lbs Starting date: 07/22/2020 Today's weight: 173 lbs Today's date: 08/05/2020 Total lbs lost to date: +1 lb Total lbs lost since last in-office visit: +1 lb  Interim History:  This is [name] [name]'s first follow-up visit with [place].  She found it easy to follow the plan.  She is not eating in bed, which is a win for her.  "I was more mindful of eating and less snacking.  I did not eat out or go to [employer].  I was proud of myself."  She is on her menstrual cycle currently.  She says she ate out over the weekend and had french fries, etc., and went to the fair the weekend prior and had "fair food".  She has not been using the scale at all yet.  Assessment/Plan:   1. Insulin resistance New.  Discussed labs with patient today.  [name] [name] has a diagnosis of insulin resistance based on her elevated fasting insulin level >5. She continues to work on diet and exercise to decrease her risk of diabetes.  Plan:  Handout given on IR after extensive discussion with her today.  Lab Results  Component Value Date   INSULIN 8.3 07/22/2020   Lab Results  Component Value Date   HGBA1C 5.4 07/22/2020   2. Other iron deficiency anemia Discussed labs with patient today.  [name] [name] is not a vegetarian.  She does not have a history of weight loss surgery.   Plan:  Continue ferrous sulfate, as per below.  CBC Latest Ref Rng & Units 07/22/2020 09/21/2019 01/26/2017  WBC 3.4 - 10.8 x10E3/uL 7.2 7.0 6.6  Hemoglobin 11.1 - 15.9 g/dL 09.6 28.3 66.2  Hematocrit 34.0 - 46.6 % 42.9 43.6 38.9  Platelets 150 - 450 x10E3/uL 409 338 334   Lab Results    Component Value Date   IRON 77 07/22/2020   TIBC 367 07/22/2020   FERRITIN 70 07/22/2020   Lab Results  Component Value Date   VITAMINB12 593 07/22/2020   -Refill ferrous sulfate 325 (65 FE) MG EC tablet; Take 1 tablet (325 mg total) by mouth daily with breakfast.  Dispense: 30 tablet; Refill: 0  3. Vitamin D deficiency Discussed labs with patient today.  [name] [name]'s Vitamin D level was 49.1 on 07/22/2020. She is currently taking no vitamin D supplement. She denies nausea, vomiting or muscle weakness.  She had been on weekly vitamin D 3-4 times a year at least.  She is off for a couple of month and then her levels drop.  Plan:  Start weekly vitamin D, as per below.  Continue until we tell her otherwise.  Recheck level in 3 months.  -Start Vitamin D, Ergocalciferol, (DRISDOL) 1.25 MG (50000 UNIT) CAPS capsule; Take 1 capsule (50,000 Units total) by mouth every 7 (seven) days.  Dispense: 4 capsule; Refill: 0  4. At risk for diabetes mellitus - [name] [name] was given extensive diabetes prevention education and counseling today of more than 30 minutes.  - Counseled patient on pathophysiology of disease and discussed various treatment options which always includes dietary and lifestyle modification as first line.   - Importance of  healthy diet with very limited amounts of simple carbohydrates discussed with patient in addition to regular aerobic exercise to an eventual goal of 5d/week or more.  - Handouts provided at patient's desire and or told to go online at the American Diabetes Association website for further information  5. Class 1 obesity with serious comorbidity and body mass index (BMI) of 30.0 to 30.9 in adult, unspecified obesity type  [name] [name] is currently in the action stage of change. As such, her goal is to continue with weight loss efforts. She has agreed to the Category 1 Plan with breakfast options.   Exercise goals: As is.  Behavioral modification strategies: increasing lean  protein intake, decreasing simple carbohydrates, increasing water intake, decreasing eating out, meal planning and cooking strategies and planning for success.  [name] [name] has agreed to follow-up with our clinic in 2 weeks. She was informed of the importance of frequent follow-up visits to maximize her success with intensive lifestyle modifications for her multiple health conditions.   Objective:   Blood pressure 115/74, pulse 88, temperature (!) 97.4 F (36.3 C), height 5\' 2"  (1.575 m), weight 173 lb (78.5 kg), SpO2 98 %. Body mass index is 31.64 kg/m.  General: Cooperative, alert, well developed, in no acute distress. HEENT: Conjunctivae and lids unremarkable. Cardiovascular: Regular rhythm.  Lungs: Normal work of breathing. Neurologic: No focal deficits.   Lab Results  Component Value Date   CREATININE 0.75 07/22/2020   BUN 6 07/22/2020   NA 136 07/22/2020   K 4.8 07/22/2020   CL 102 07/22/2020   CO2 20 07/22/2020   Lab Results  Component Value Date   ALT 15 07/22/2020   AST 19 07/22/2020   ALKPHOS 64 07/22/2020   BILITOT 0.3 07/22/2020   Lab Results  Component Value Date   HGBA1C 5.4 07/22/2020   Lab Results  Component Value Date   INSULIN 8.3 07/22/2020   Lab Results  Component Value Date   TSH 1.450 07/22/2020   Lab Results  Component Value Date   CHOL 225 (H) 07/22/2020   HDL 78 07/22/2020   LDLCALC 128 (H) 07/22/2020   TRIG 109 07/22/2020   CHOLHDL 2.9 07/22/2020   Lab Results  Component Value Date   WBC 7.2 07/22/2020   HGB 14.4 07/22/2020   HCT 42.9 07/22/2020   MCV 89 07/22/2020   PLT 409 07/22/2020   Lab Results  Component Value Date   IRON 77 07/22/2020   TIBC 367 07/22/2020   FERRITIN 70 07/22/2020   Attestation Statements:   Reviewed by clinician on day of visit: allergies, medications, problem list, medical history, surgical history, family history, social history, and previous encounter notes.  I, 07/24/2020, CMA, am acting as  Insurance claims handler for Energy manager, DO.  I have reviewed the above documentation for accuracy and completeness, and I agree with the above. [employer], D.O.  The 21st Century Cures Act was signed into law in 2016 which includes the topic of electronic health records.  This provides immediate access to information in MyChart.  This includes consultation notes, operative notes, office notes, lab results and pathology reports.  If you have any questions about what you read please let 2017 know at your next visit so we can discuss your concerns and take corrective action if need be.  We are right here with you.

## 2020-08-25 ENCOUNTER — Telehealth (INDEPENDENT_AMBULATORY_CARE_PROVIDER_SITE_OTHER): Payer: Self-pay

## 2020-08-25 ENCOUNTER — Telehealth (INDEPENDENT_AMBULATORY_CARE_PROVIDER_SITE_OTHER): Payer: 59 | Admitting: Family Medicine

## 2020-08-25 ENCOUNTER — Encounter (INDEPENDENT_AMBULATORY_CARE_PROVIDER_SITE_OTHER): Payer: Self-pay | Admitting: Family Medicine

## 2020-08-25 ENCOUNTER — Other Ambulatory Visit: Payer: Self-pay

## 2020-08-25 VITALS — Ht 62.0 in | Wt 173.0 lb

## 2020-08-25 DIAGNOSIS — Z9189 Other specified personal risk factors, not elsewhere classified: Secondary | ICD-10-CM | POA: Diagnosis not present

## 2020-08-25 DIAGNOSIS — E88819 Insulin resistance, unspecified: Secondary | ICD-10-CM

## 2020-08-25 DIAGNOSIS — E559 Vitamin D deficiency, unspecified: Secondary | ICD-10-CM

## 2020-08-25 DIAGNOSIS — E8881 Metabolic syndrome: Secondary | ICD-10-CM | POA: Diagnosis not present

## 2020-08-25 DIAGNOSIS — E669 Obesity, unspecified: Secondary | ICD-10-CM

## 2020-08-25 DIAGNOSIS — Z6831 Body mass index (BMI) 31.0-31.9, adult: Secondary | ICD-10-CM | POA: Diagnosis not present

## 2020-08-25 DIAGNOSIS — E66811 Obesity, class 1: Secondary | ICD-10-CM

## 2020-08-25 MED ORDER — VITAMIN D (ERGOCALCIFEROL) 1.25 MG (50000 UNIT) PO CAPS: 50000.0000 [IU] | ORAL_CAPSULE | ORAL | 0 refills | Status: AC

## 2020-08-25 NOTE — Telephone Encounter (Signed)
Verbal consent obtained to conduct video visit, via telehealth 

## 2020-08-27 NOTE — Progress Notes (Signed)
TeleHealth Visit:  Due to the COVID-19 pandemic, this visit was completed with telemedicine (audio/video) technology to reduce patient and provider exposure as well as to preserve personal protective equipment.   [name] [name] has verbally consented to this TeleHealth visit. The patient is located at home, the provider is located at the [facility] office. The participants in this visit include the listed provider and patient. The visit was conducted today via video visit.   Chief Complaint: OBESITY [name] [name] is here to discuss her progress with her obesity treatment plan along with follow-up of her obesity related diagnoses. [name] [name] is on the Category 1 Plan with breakfast options and states she is following her eating plan approximately 80% of the time. [name] [name] states she is doing gym exercise, stationary bike, and walking 30 minutes 4 times per week.  Today's visit was #: 3 Starting weight: 172 lbs Starting date: 07/22/2020  Interim History: [name] [name] was in [place] for 1 week for work. She started going to the gym once she returned. She notes being a little more hungry now that she is actually eating.  Assessment/Plan:   1. Insulin Resistance [place] has a diagnosis of insulin resistance based on her elevated fasting insulin level >5. She continues to work on diet and exercise to decrease her risk of diabetes.  Lab Results  Component Value Date   INSULIN 8.3 07/22/2020   Lab Results  Component Value Date   HGBA1C 5.4 07/22/2020   Plan: [name] [name] will continue to work on weight loss, exercise, and decreasing simple carbohydrates to help decrease the risk of diabetes. [name] [name] agreed to follow-up with [place] as directed to closely monitor her progress.    2. Vitamin D deficiency [name] [name] started Vit D supplement at her last office visit. She feels more energetic. [name] [name]'s Vitamin D level was 49.1 on 07/22/2020. She is currently taking prescription vitamin D 50,000 IU each week. She  denies nausea, vomiting or muscle weakness.  Plan: - Reiterated importance of vitamin D (as well as calcium) to their health and wellbeing.  - Reminded [name] [name] that weight loss will likely improve availability of vitamin D, thus encouraged her to continue with meal plan and their weight loss efforts to further improve this condition. - I recommend patient continue to take weekly prescription vit D 50,000 IU - Informed patient this may be a lifelong thing, and she was encouraged to continue to take the medicine until told otherwise.   - We will need to monitor levels regularly (every 3-4 mo on average) to keep levels within normal limits.  - pt's questions and concerns regarding this condition addressed.  Refill- Vitamin D, Ergocalciferol, (DRISDOL) 1.25 MG (50000 UNIT) CAPS capsule; Take 1 capsule (50,000 Units total) by mouth every 7 (seven) days.  Dispense: 4 capsule; Refill: 0    3. At risk for dehydration [name] [name] is at higher than average risk of dehydration.  [name] [name] was given more than 9 minutes of proper hydration counseling today. We discussed the signs and symptoms of dehydration some of which may include muscle cramping, constipation or even orthostatic symptoms. Counseling on the prevention of dehydration was also provided today. [name] [name] is at risk for dehydration due to weight loss, lifestyle and behavorial habits and possibly due to taking certain medication(s). She was encouraged to adequately hydrate and monitor fluid status to avoid dehydration as well as weight loss plateaus.  Unless pre-existing renal or cardiopulmonary conditions exist which pt was told to limit their fluid intake, I  recommended roughly one half of their weight in pounds to be the approximate ounces of non-caloric, non-caffeinated beverages they should drink per day; including more if they are engaging in exercise.  4. Class 1 obesity with serious comorbidity and body mass index (BMI) of 31.0 to 31.9 in  adult, unspecified obesity type [name] [name] is currently in the action stage of change. As such, her goal is to continue with weight loss efforts. She has agreed to the Category 1 Plan with breakfast options, with 6-8 oz at lunch for protein (not just 4 oz).   Exercise goals: As is  Behavioral modification strategies: increasing lean protein intake, meal planning and cooking strategies, holiday eating strategies  and celebration eating strategies.  [name] [name] has agreed to follow-up with our clinic in 2 weeks. She was informed of the importance of frequent follow-up visits to maximize her success with intensive lifestyle modifications for her multiple health conditions.  Objective:   VITALS: Per patient if applicable, see vitals. GENERAL: Alert and in no acute distress. CARDIOPULMONARY: No increased WOB. Speaking in clear sentences.  PSYCH: Pleasant and cooperative. Speech normal rate and rhythm. Affect is appropriate. Insight and judgement are appropriate. Attention is focused, linear, and appropriate.  NEURO: Oriented as arrived to appointment on time with no prompting.   Lab Results  Component Value Date   CREATININE 0.75 07/22/2020   BUN 6 07/22/2020   NA 136 07/22/2020   K 4.8 07/22/2020   CL 102 07/22/2020   CO2 20 07/22/2020   Lab Results  Component Value Date   ALT 15 07/22/2020   AST 19 07/22/2020   ALKPHOS 64 07/22/2020   BILITOT 0.3 07/22/2020   Lab Results  Component Value Date   HGBA1C 5.4 07/22/2020   Lab Results  Component Value Date   INSULIN 8.3 07/22/2020   Lab Results  Component Value Date   TSH 1.450 07/22/2020   Lab Results  Component Value Date   CHOL 225 (H) 07/22/2020   HDL 78 07/22/2020   LDLCALC 128 (H) 07/22/2020   TRIG 109 07/22/2020   CHOLHDL 2.9 07/22/2020   Lab Results  Component Value Date   WBC 7.2 07/22/2020   HGB 14.4 07/22/2020   HCT 42.9 07/22/2020   MCV 89 07/22/2020   PLT 409 07/22/2020   Lab Results  Component Value Date    IRON 77 07/22/2020   TIBC 367 07/22/2020   FERRITIN 70 07/22/2020    Attestation Statements:   Reviewed by clinician on day of visit: allergies, medications, problem list, medical history, surgical history, family history, social history, and previous encounter notes.   Edmund Hilda, am acting as Energy manager for [employer], DO.  I have reviewed the above documentation for accuracy and completeness, and I agree with the above. Carlye Grippe, D.O.  The 21st Century Cures Act was signed into law in 2016 which includes the topic of electronic health records.  This provides immediate access to information in MyChart.  This includes consultation notes, operative notes, office notes, lab results and pathology reports.  If you have any questions about what you read please let us know at your next visit so we can discuss your concerns and take corrective action if need be.  We are right here with you.

## 2021-02-25 ENCOUNTER — Other Ambulatory Visit: Payer: Self-pay

## 2021-02-25 ENCOUNTER — Emergency Department (HOSPITAL_BASED_OUTPATIENT_CLINIC_OR_DEPARTMENT_OTHER)
Admission: EM | Admit: 2021-02-25 | Discharge: 2021-02-25 | Disposition: A | Discharge status: Home / Self Care | Payer: 59 | Attending: Emergency Medicine | Admitting: Emergency Medicine

## 2021-02-25 ENCOUNTER — Emergency Department (HOSPITAL_BASED_OUTPATIENT_CLINIC_OR_DEPARTMENT_OTHER): Payer: 59

## 2021-02-25 ENCOUNTER — Encounter (HOSPITAL_BASED_OUTPATIENT_CLINIC_OR_DEPARTMENT_OTHER): Payer: Self-pay | Admitting: *Deleted

## 2021-02-25 DIAGNOSIS — Z8616 Personal history of COVID-19: Secondary | ICD-10-CM | POA: Insufficient documentation

## 2021-02-25 DIAGNOSIS — R079 Chest pain, unspecified: Secondary | ICD-10-CM | POA: Insufficient documentation

## 2021-02-25 LAB — COMPREHENSIVE METABOLIC PANEL
ALT: 10 U/L (ref 0–44)
AST: 14 U/L — ABNORMAL LOW (ref 15–41)
Albumin: 4.3 g/dL (ref 3.5–5.0)
Alkaline Phosphatase: 48 U/L (ref 38–126)
Anion gap: 10 (ref 5–15)
BUN: 12 mg/dL (ref 6–20)
CO2: 25 mmol/L (ref 22–32)
Calcium: 9.5 mg/dL (ref 8.9–10.3)
Chloride: 104 mmol/L (ref 98–111)
Creatinine, Ser: 0.61 mg/dL (ref 0.44–1.00)
GFR, Estimated: >60 mL/min (ref >60)
Glucose, Bld: 88 mg/dL (ref 70–99)
Potassium: 3.9 mmol/L (ref 3.5–5.1)
Sodium: 139 mmol/L (ref 135–145)
Total Bilirubin: 0.5 mg/dL (ref 0.3–1.2)
Total Protein: 8 g/dL (ref 6.5–8.1)

## 2021-02-25 LAB — CBC WITH DIFFERENTIAL/PLATELET
Abs Immature Granulocytes: 0.01 10*3/uL (ref 0.00–0.07)
Basophils Absolute: 0 10*3/uL (ref 0.0–0.1)
Basophils Relative: 0 %
Eosinophils Absolute: 0.1 10*3/uL (ref 0.0–0.5)
Eosinophils Relative: 1 %
HCT: 41.9 % (ref 36.0–46.0)
Hemoglobin: 13.9 g/dL (ref 12.0–15.0)
Immature Granulocytes: 0 %
Lymphocytes Relative: 47 %
Lymphs Abs: 3.5 10*3/uL (ref 0.7–4.0)
MCH: 30.1 pg (ref 26.0–34.0)
MCHC: 33.2 g/dL (ref 30.0–36.0)
MCV: 90.7 fL (ref 80.0–100.0)
Monocytes Absolute: 0.5 10*3/uL (ref 0.1–1.0)
Monocytes Relative: 7 %
Neutro Abs: 3.4 10*3/uL (ref 1.7–7.7)
Neutrophils Relative %: 45 %
Platelets: 374 10*3/uL (ref 150–400)
RBC: 4.62 MIL/uL (ref 3.87–5.11)
RDW: 13 % (ref 11.5–15.5)
WBC: 7.6 10*3/uL (ref 4.0–10.5)
nRBC: 0 % (ref 0.0–0.2)

## 2021-02-25 LAB — LIPASE, BLOOD: Lipase: 29 U/L (ref 11–51)

## 2021-02-25 LAB — D-DIMER, QUANTITATIVE: D-Dimer, Quant: <0.27 ug/mL-FEU (ref 0.00–0.50)

## 2021-02-25 LAB — TROPONIN I (HIGH SENSITIVITY)
Troponin I (High Sensitivity): <2 ng/L (ref <18)
Troponin I (High Sensitivity): <2 ng/L (ref <18)

## 2021-02-25 IMAGING — DX DG CHEST 1V PORT
1 series · 1 of 1 positions shown · non-contrast
Comparison: CT [DATE].  Chest x-ray [DATE].

CLINICAL DATA: Chest pain.

EXAM:
PORTABLE CHEST 1 VIEW

[chest]
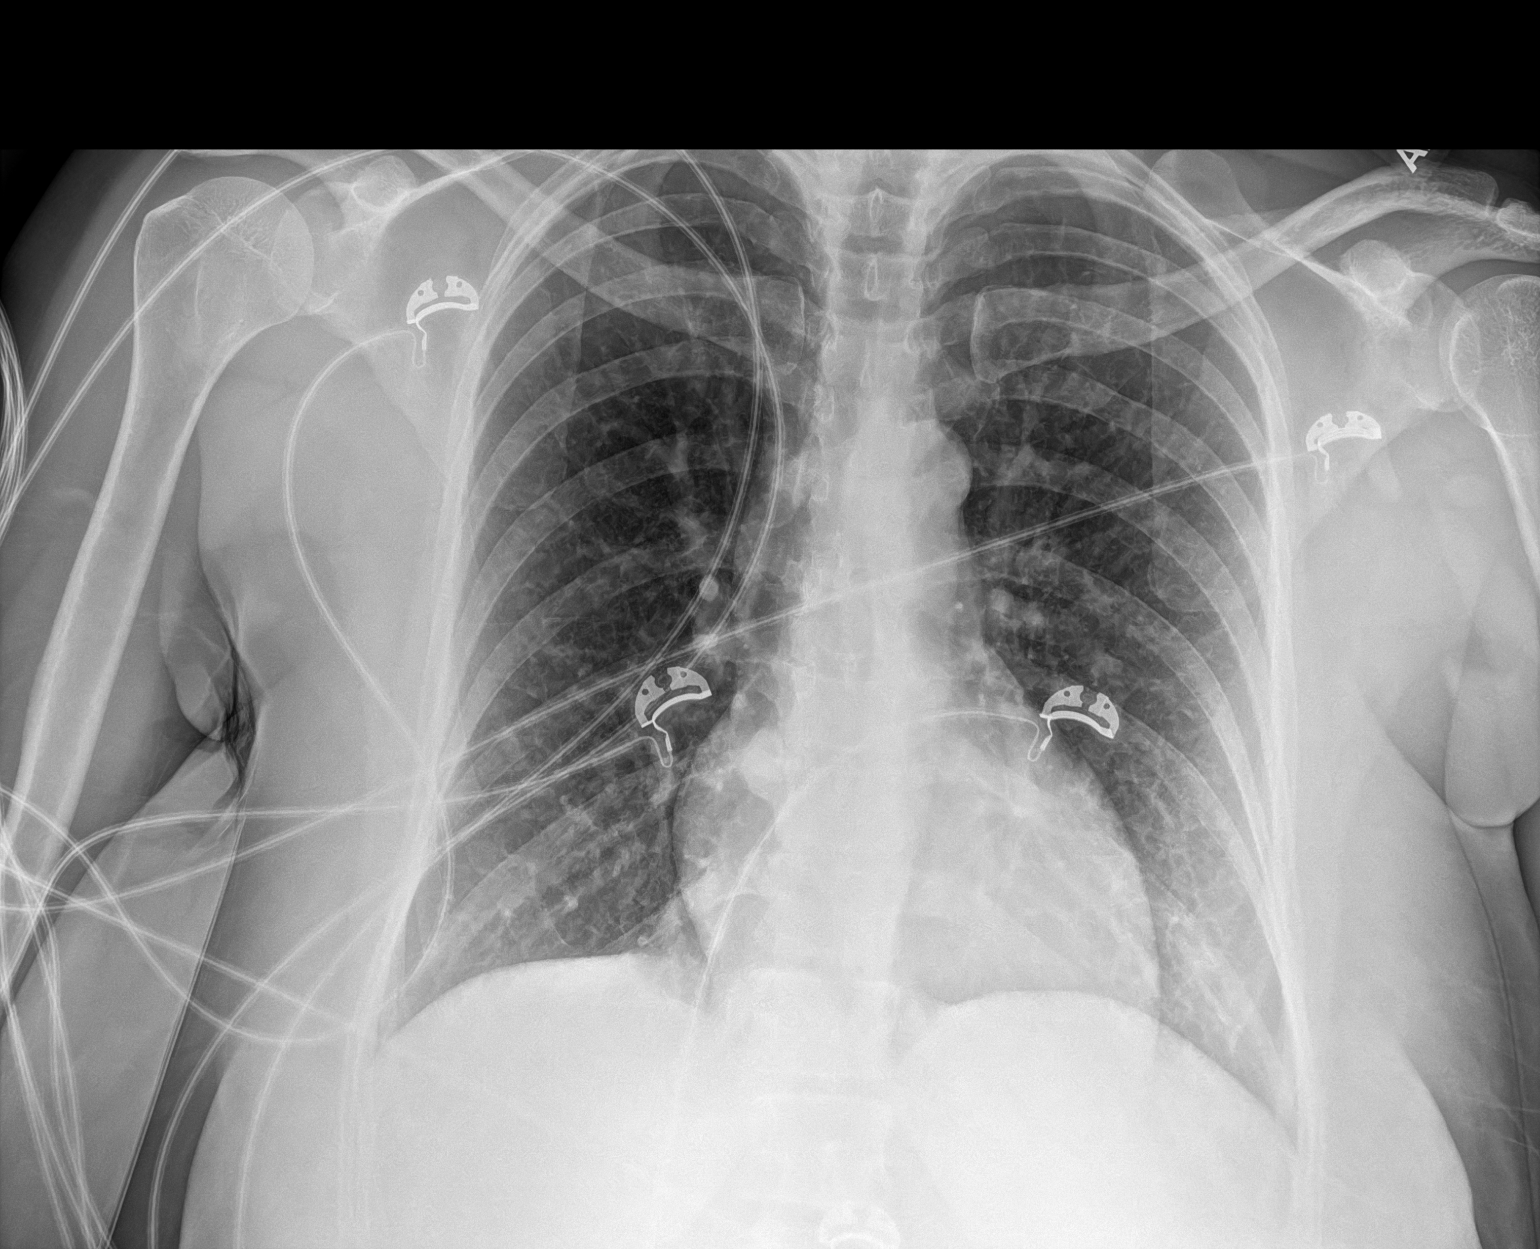

[1 of 1 positions shown; findings below may reference images not displayed]

FINDINGS: Mediastinum and hilar structures normal. Lungs are clear. No pleural
effusion or pneumothorax. Heart size normal. Degenerative change
thoracic spine.
IMPRESSION: No acute cardiopulmonary disease

## 2021-02-25 MED ORDER — FAMOTIDINE 20 MG PO TABS: 20.0000 mg | ORAL_TABLET | Freq: Two times a day (BID) | ORAL | 0 refills | Status: AC

## 2021-02-25 MED ORDER — LIDOCAINE VISCOUS HCL 2 % MT SOLN
15.0000 mL | Freq: Once | OROMUCOSAL | Status: AC
  Administered 2021-02-25: 15 mL via ORAL
  Filled 2021-02-25: qty 15

## 2021-02-25 MED ORDER — ALUM & MAG HYDROXIDE-SIMETH 200-200-20 MG/5ML PO SUSP
30.0000 mL | Freq: Once | ORAL | Status: AC
  Administered 2021-02-25: 30 mL via ORAL
  Filled 2021-02-25: qty 30

## 2021-02-25 NOTE — ED Notes (Signed)
Patient verbalizes understanding of discharge instructions. Opportunity for questioning and answers were provided. Armband removed by staff, pt discharged from ED. Pt. ambulatory and discharged home.  
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
## 2021-02-25 NOTE — Discharge Instructions (Signed)
The symptoms you are experiencing today may be caused by several issues, including a possible hiatal hernia.  I have started you on a new medicine called famotidine to help with the acid reflux in your stomach.  Your heart work-up was reassuring in the ER.  However, as I explained, it is very important for you to follow-up as an outpatient for this issue.

## 2021-02-25 NOTE — ED Triage Notes (Signed)
Mid chest pain radiating to her right chest and it doesn't feels right when she moves a certain way  this morning.

## 2021-02-25 NOTE — ED Notes (Signed)
ED Provider at bedside. 

## 2021-05-14 ENCOUNTER — Encounter: Payer: Self-pay | Admitting: Interventional Cardiology

## 2021-05-14 ENCOUNTER — Other Ambulatory Visit: Payer: Self-pay

## 2021-05-14 ENCOUNTER — Ambulatory Visit: Payer: 59 | Admitting: Interventional Cardiology

## 2021-05-14 VITALS — BP 120/90 | HR 100 | Ht 62.0 in | Wt 181.2 lb

## 2021-05-14 DIAGNOSIS — E669 Obesity, unspecified: Secondary | ICD-10-CM

## 2021-05-14 DIAGNOSIS — K449 Diaphragmatic hernia without obstruction or gangrene: Secondary | ICD-10-CM | POA: Diagnosis not present

## 2021-05-14 DIAGNOSIS — R079 Chest pain, unspecified: Secondary | ICD-10-CM | POA: Diagnosis not present

## 2021-05-14 DIAGNOSIS — Z87898 Personal history of other specified conditions: Secondary | ICD-10-CM

## 2021-05-14 NOTE — Patient Instructions (Signed)
Medication Instructions:  Your physician recommends that you continue on your current medications as directed. Please refer to the Current Medication list given to you today.  *If you need a refill on your cardiac medications before your next appointment, please call your pharmacy*   Lab Work: none If you have labs (blood work) drawn today and your tests are completely normal, you will receive your results only by: MyChart Message (if you have MyChart) OR A paper copy in the mail If you have any lab test that is abnormal or we need to change your treatment, we will call you to review the results.   Testing/Procedures: Dr Eldridge Dace recommends you have a Calcium Score CT Scan done   Follow-Up: At [facility], you and your health needs are our priority.  As part of our continuing mission to provide you with exceptional heart care, we have created designated Provider Care Teams.  These Care Teams include your primary Cardiologist (physician) and Advanced Practice Providers (APPs -  Physician Assistants and Nurse Practitioners) who all work together to provide you with the care you need, when you need it.  We recommend signing up for the patient portal called "MyChart".  Sign up information is provided on this After Visit Summary.  MyChart is used to connect with patients for Virtual Visits (Telemedicine).  Patients are able to view lab/test results, encounter notes, upcoming appointments, etc.  Non-urgent messages can be sent to your provider as well.   To learn more about what you can do with MyChart, go to [email or web address].    Your next appointment:   As needed  The format for your next appointment:   In Person  Provider:   You may see Lance Muss, MD or one of the following Advanced Practice Providers on your designated Care Team:   Ronie Spies, PA-C Jacolyn Reedy, PA-C   Other Instructions High-Fiber Eating Plan Fiber, also called dietary fiber, is a type of  carbohydrate. It is found foods such as fruits, vegetables, whole grains, and beans. A high-fiber diet can have many health benefits. Your health care provider may recommend a high-fiber diet to help: Prevent constipation. Fiber can make your bowel movements more regular. Lower your cholesterol. Relieve the following conditions: Inflammation of veins in the anus (hemorrhoids). Inflammation of specific areas of the digestive tract (uncomplicated diverticulosis). A problem of the large intestine, also called the colon, that sometimes causes pain and diarrhea (irritable bowel syndrome, or IBS). Prevent overeating as part of a weight-loss plan. Prevent heart disease, type 2 diabetes, and certain cancers. What are tips for following this plan? Reading food labels  Check the nutrition facts label on food products for the amount of dietary fiber. Choose foods that have 5 grams of fiber or more per serving. The goals for recommended daily fiber intake include: Men (age 75 or younger): 34-38 g. Men (over age 66): 28-34 g. Women (age 73 or younger): 25-28 g. Women (over age 47): 22-25 g. Your daily fiber goal is _____________ g. Shopping Choose whole fruits and vegetables instead of processed forms, such as apple juice or applesauce. Choose a wide variety of high-fiber foods such as avocados, lentils, oats, and kidney beans. Read the nutrition facts label of the foods you choose. Be aware of foods with added fiber. These foods often have high sugar and sodium amounts per serving. Cooking Use whole-grain flour for baking and cooking. Cook with brown rice instead of white rice. Meal planning Start the day with a breakfast  that is high in fiber, such as a cereal that contains 5 g of fiber or more per serving. Eat breads and cereals that are made with whole-grain flour instead of refined flour or white flour. Eat brown rice, bulgur wheat, or millet instead of white rice. Use beans in place of meat in  soups, salads, and pasta dishes. Be sure that half of the grains you eat each day are whole grains. General information You can get the recommended daily intake of dietary fiber by: Eating a variety of fruits, vegetables, grains, nuts, and beans. Taking a fiber supplement if you are not able to take in enough fiber in your diet. It is better to get fiber through food than from a supplement. Gradually increase how much fiber you consume. If you increase your intake of dietary fiber too quickly, you may have bloating, cramping, or gas. Drink plenty of water to help you digest fiber. Choose high-fiber snacks, such as berries, raw vegetables, nuts, and popcorn. What foods should I eat? Fruits Berries. Pears. Apples. Oranges. Avocado. Prunes and raisins. Dried figs. Vegetables Sweet potatoes. Spinach. Kale. Artichokes. Cabbage. Broccoli. Cauliflower.Green peas. Carrots. Squash. Grains Whole-grain breads. Multigrain cereal. Oats and oatmeal. Brown rice. Barley.Bulgur wheat. Millet. Quinoa. Bran muffins. Popcorn. Rye wafer crackers. Meats and other proteins Navy beans, kidney beans, and pinto beans. Soybeans. Split peas. Lentils. Nutsand seeds. Dairy Fiber-fortified yogurt. Beverages Fiber-fortified soy milk. Fiber-fortified orange juice. Other foods Fiber bars. The items listed above may not be a complete list of recommended foods and beverages. Contact a dietitian for more information. What foods should I avoid? Fruits Fruit juice. Cooked, strained fruit. Vegetables Fried potatoes. Canned vegetables. Well-cooked vegetables. Grains White bread. Pasta made with refined flour. White rice. Meats and other proteins Fatty cuts of meat. Fried chicken or fried fish. Dairy Milk. Yogurt. Cream cheese. Sour cream. Fats and oils Butters. Beverages Soft drinks. Other foods Cakes and pastries. The items listed above may not be a complete list of foods and beverages to avoid. Talk with your  dietitian about what choices are best for you. Summary Fiber is a type of carbohydrate. It is found in foods such as fruits, vegetables, whole grains, and beans. A high-fiber diet has many benefits. It can help to prevent constipation, lower blood cholesterol, aid weight loss, and reduce your risk of heart disease, diabetes, and certain cancers. Increase your intake of fiber gradually. Increasing fiber too quickly may cause cramping, bloating, and gas. Drink plenty of water while you increase the amount of fiber you consume. The best sources of fiber include whole fruits and vegetables, whole grains, nuts, seeds, and beans. This information is not intended to replace advice given to you by your health care provider. Make sure you discuss any questions you have with your healthcare provider. Document Revised: 01/24/2020 Document Reviewed: 01/24/2020 Elsevier Patient Education  2022 [employer].
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
## 2021-05-15 ENCOUNTER — Ambulatory Visit (INDEPENDENT_AMBULATORY_CARE_PROVIDER_SITE_OTHER)
Admission: RE | Admit: 2021-05-15 | Discharge: 2021-05-15 | Disposition: A | Discharge status: Home / Self Care | Payer: Self-pay | Source: Ambulatory Visit | Attending: Interventional Cardiology | Admitting: Interventional Cardiology

## 2021-05-15 ENCOUNTER — Encounter: Payer: Self-pay | Admitting: Radiology

## 2021-05-15 DIAGNOSIS — Z87898 Personal history of other specified conditions: Secondary | ICD-10-CM

## 2021-05-15 IMAGING — CT CT CARDIAC CORONARY ARTERY CALCIUM SCORE
3 series · 14 of 20 positions shown, 16 images · non-contrast
Comparison: None.
COMPARISON: None.

Addendum:
EXAM:
OVER-READ INTERPRETATION  CT CHEST

The following report is an over-read performed by radiologist Dr.
LIZMARIE [REDACTED] on [DATE]. This
over-read does not include interpretation of cardiac or coronary
anatomy or pathology. The coronary calcium score interpretation by
the cardiologist is attached.
CLINICAL DATA: Cardiovascular disease risk stratification
CT Coronary Calcium Score
TECHNIQUE: A gated, non-contrast computed tomography scan of the heart was
performed using 3mm slice thickness. Axial images were analyzed on a
dedicated workstation. Calcium scoring of the coronary arteries was
performed using the Agatston method.

[Series 2: casc 2.0 sa36 2 bestdiast 72 % (id) · axial · 0.42mm/px · z∈[-204,-110]mm · 6 of 67 slices shown, 8 images]
[im 10/67  vessel]
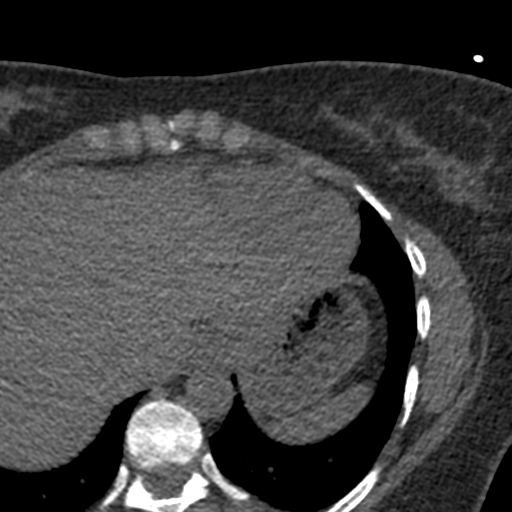
[im 10/67  lung]
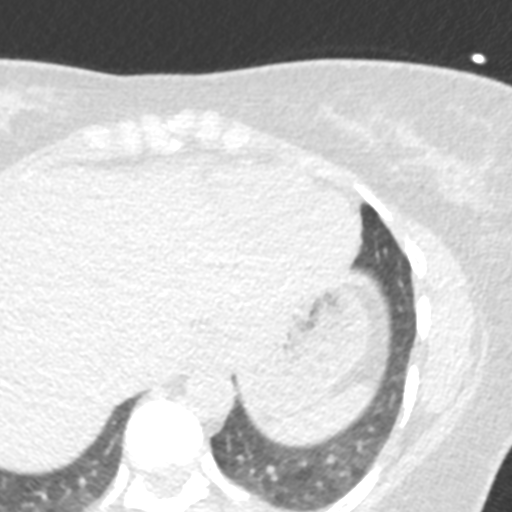
[im 19/67  vessel]
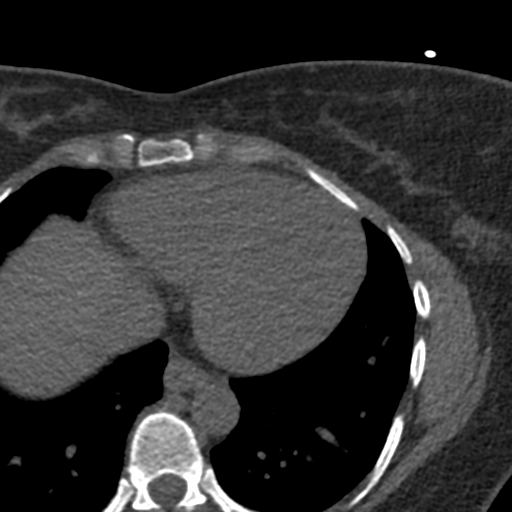
[im 29/67  vessel]
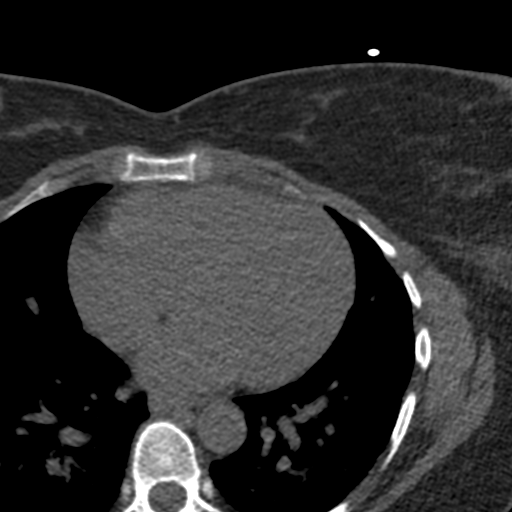
[im 38/67  vessel]
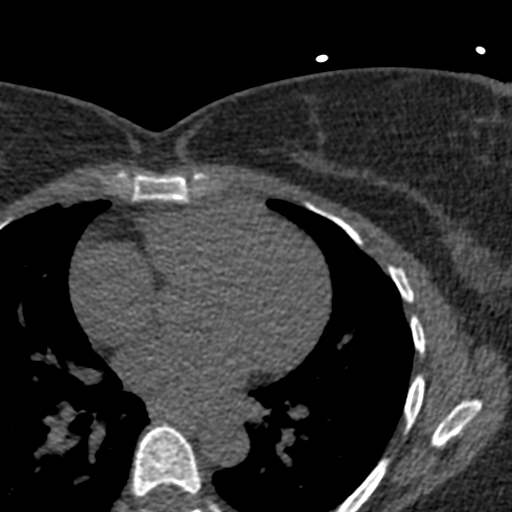
[im 48/67  vessel]
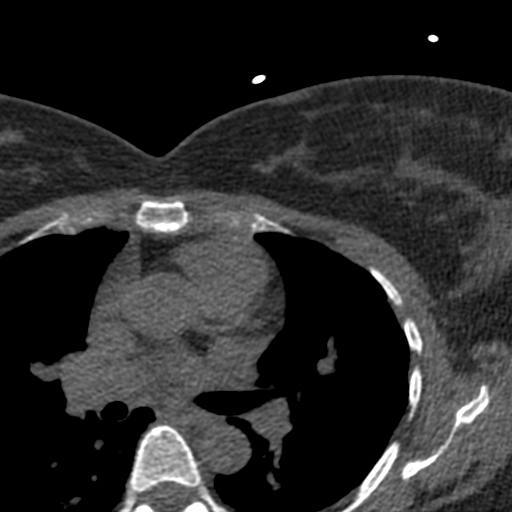
[im 48/67  lung]
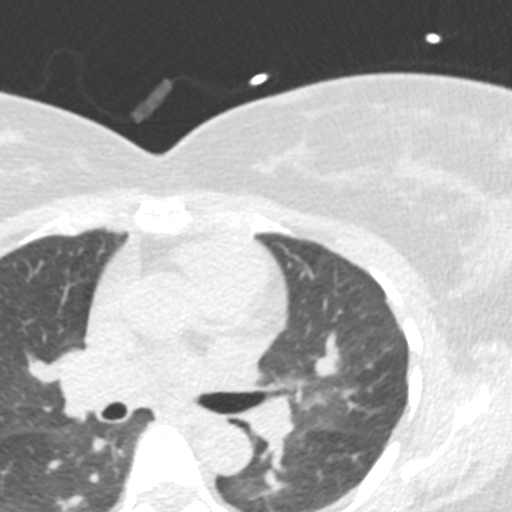
[im 57/67  vessel]
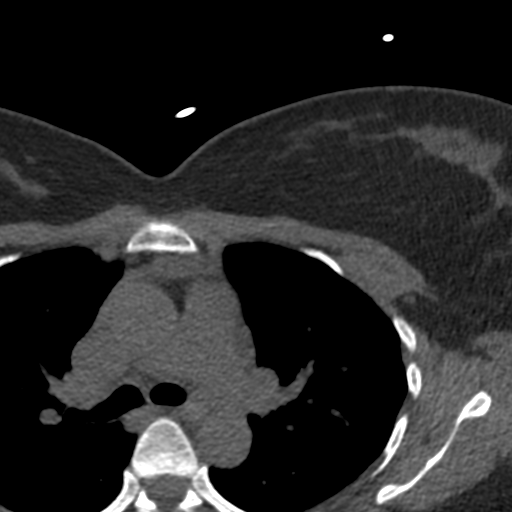

[Series 3: lung 77 % · axial · 0.64mm/px · z∈[-198,-116]mm · 4 of 45 slices shown]
[im 9/45  lung]
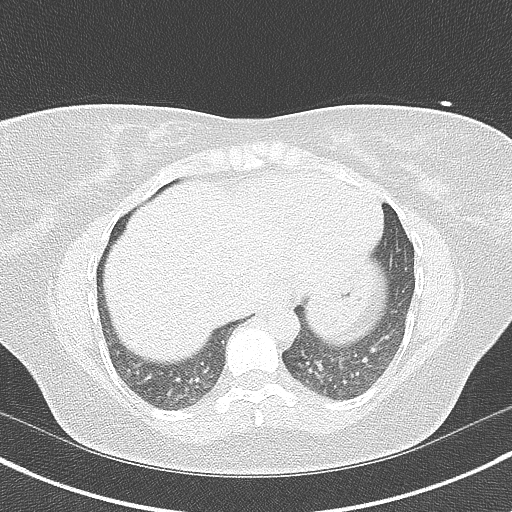
[im 18/45  lung]
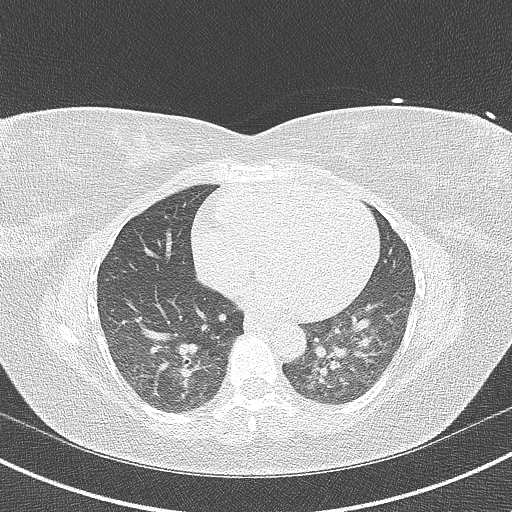
[im 27/45  lung]
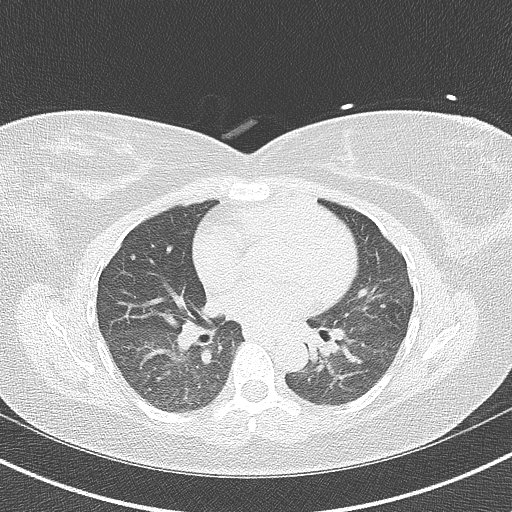
[im 36/45  lung]
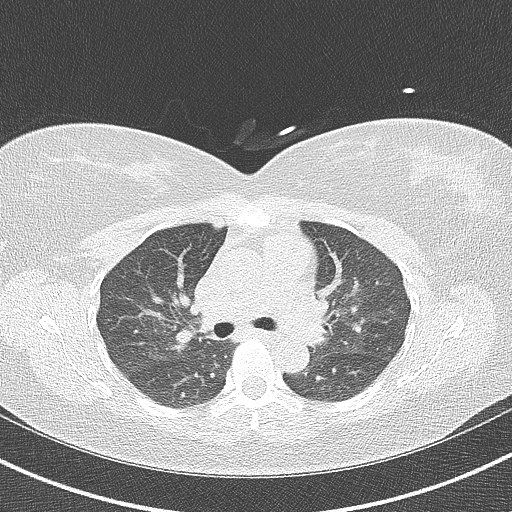

[Series 4: lung st 77 % · axial · 0.64mm/px · z∈[-198,-116]mm · 4 of 45 slices shown]
[im 9/45  lung]
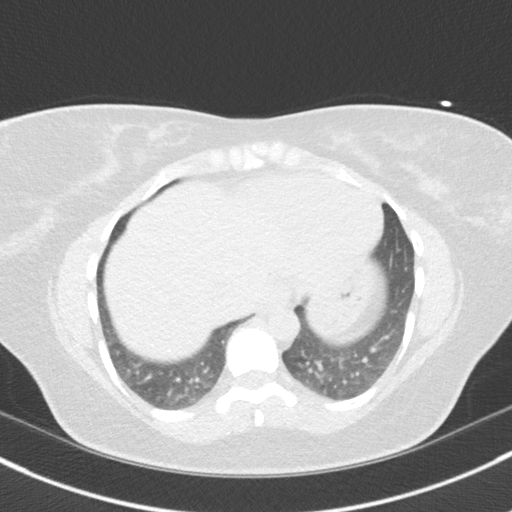
[im 18/45  lung]
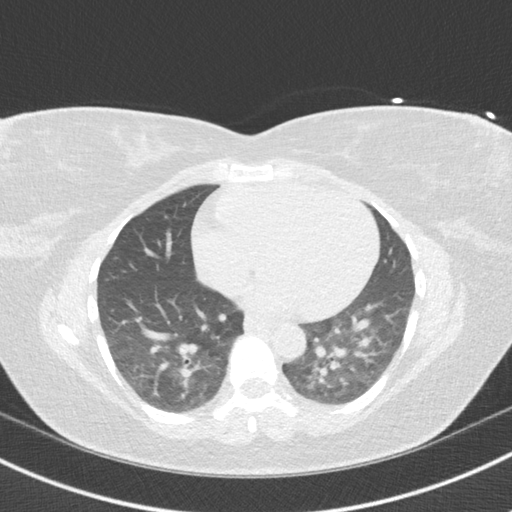
[im 27/45  lung]
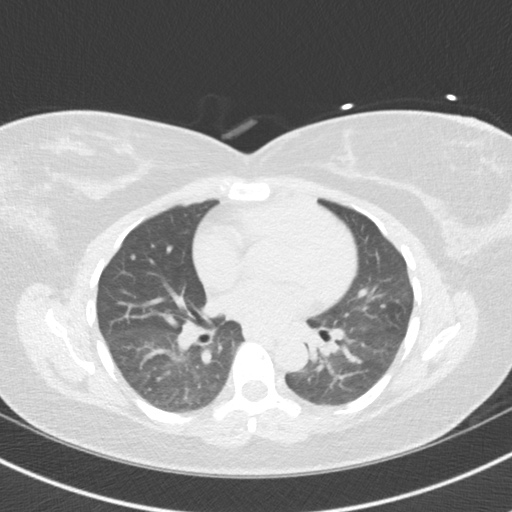
[im 36/45  lung]
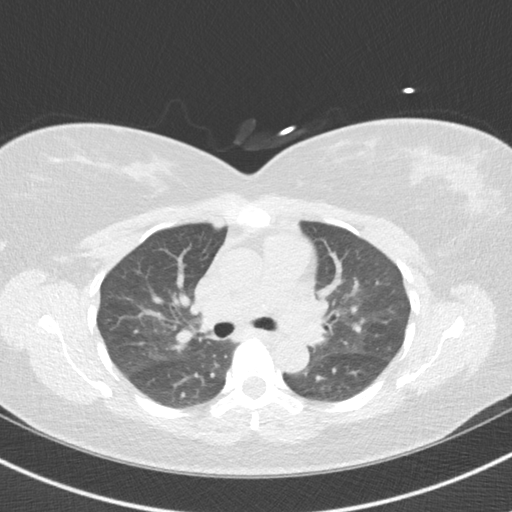

[14 of 20 positions shown; findings below may reference images not displayed]

FINDINGS: 3 mm right middle lobe nodule (axial image 19 of series 3) and
additional nodule in the medial segment of the right middle lobe
associated with the minor fissure (axial image 17 of series 3)
measuring 8 x 6 mm (mean diameter of 7 mm) are both unchanged in
retrospect compared to remote prior chest CTA [DATE], considered
definitively benign. Within the visualized portions of the thorax
there are no other larger more suspicious appearing pulmonary
nodules or masses, there is no acute consolidative airspace disease,
no pleural effusions, no pneumothorax and no lymphadenopathy.
Visualized portions of the upper abdomen are unremarkable. There are
no aggressive appearing lytic or blastic lesions noted in the
visualized portions of the skeleton.
IMPRESSION: 1. No significant incidental noncardiac findings are noted.
FINDINGS: Coronary arteries: Normal origins.

Coronary Calcium Score:

Left main: 0

Left anterior descending artery: 0

Left circumflex artery: 0

Right coronary artery: 0

Total: 0

Pericardium: Normal.

Ascending Aorta: Normal caliber.

Non-cardiac: See separate report from [REDACTED].
IMPRESSION: Coronary calcium score of 0.



If CAC=0, it is reasonable to withhold statin therapy and reassess
in 5 to 10 years, as long as higher risk conditions are absent
(diabetes mellitus, family history of premature CHD in first degree
relatives (males <55 years; females <65 years), cigarette smoking,
or LDL >=190 mg/dL).

If CAC is 1 to 99, it is reasonable to initiate statin therapy for
patients >=55 years of age.

If CAC is >=100 or >=75th percentile, it is reasonable to initiate
statin therapy at any age.

Cardiology referral should be considered for patients with CAC
scores >=400 or >=75th percentile.

*[ZD] AHA/ACC/AACVPR/AAPA/ABC/LIZMARIE/LIZMARIE/LIZMARIE/LIZMARIE/LIZMARIE/LIZMARIE/LIZMARIE
Guideline on the Management of Blood Cholesterol: A Report of the
American College of Cardiology/American Heart Association Task Force
on Clinical Practice Guidelines. J Am Coll Cardiol.
[ZD];73(24):[PHONE_NUMBER].

*** End of Addendum ***
EXAM:
OVER-READ INTERPRETATION  CT CHEST

The following report is an over-read performed by radiologist Dr.
LIZMARIE [REDACTED] on [DATE]. This
over-read does not include interpretation of cardiac or coronary
anatomy or pathology. The coronary calcium score interpretation by
the cardiologist is attached.
FINDINGS: 3 mm right middle lobe nodule (axial image 19 of series 3) and
additional nodule in the medial segment of the right middle lobe
associated with the minor fissure (axial image 17 of series 3)
measuring 8 x 6 mm (mean diameter of 7 mm) are both unchanged in
retrospect compared to remote prior chest CTA [DATE], considered
definitively benign. Within the visualized portions of the thorax
there are no other larger more suspicious appearing pulmonary
nodules or masses, there is no acute consolidative airspace disease,
no pleural effusions, no pneumothorax and no lymphadenopathy.
Visualized portions of the upper abdomen are unremarkable. There are
no aggressive appearing lytic or blastic lesions noted in the
visualized portions of the skeleton.
IMPRESSION: 1. No significant incidental noncardiac findings are noted.

## 2021-09-07 ENCOUNTER — Other Ambulatory Visit: Payer: Self-pay

## 2021-09-07 ENCOUNTER — Emergency Department (HOSPITAL_BASED_OUTPATIENT_CLINIC_OR_DEPARTMENT_OTHER): Payer: 59 | Admitting: Radiology

## 2021-09-07 ENCOUNTER — Emergency Department (HOSPITAL_BASED_OUTPATIENT_CLINIC_OR_DEPARTMENT_OTHER)
Admission: EM | Admit: 2021-09-07 | Discharge: 2021-09-07 | Disposition: A | Discharge status: Home / Self Care | Payer: 59 | Attending: Emergency Medicine | Admitting: Emergency Medicine

## 2021-09-07 ENCOUNTER — Encounter (HOSPITAL_BASED_OUTPATIENT_CLINIC_OR_DEPARTMENT_OTHER): Payer: Self-pay | Admitting: *Deleted

## 2021-09-07 DIAGNOSIS — M25561 Pain in right knee: Secondary | ICD-10-CM | POA: Diagnosis present

## 2021-09-07 DIAGNOSIS — Y9301 Activity, walking, marching and hiking: Secondary | ICD-10-CM | POA: Insufficient documentation

## 2021-09-07 DIAGNOSIS — W108XXA Fall (on) (from) other stairs and steps, initial encounter: Secondary | ICD-10-CM | POA: Insufficient documentation

## 2021-09-07 DIAGNOSIS — M25551 Pain in right hip: Secondary | ICD-10-CM | POA: Insufficient documentation

## 2021-09-07 LAB — CBC WITH DIFFERENTIAL/PLATELET
Abs Immature Granulocytes: 0.08 10*3/uL — ABNORMAL HIGH (ref 0.00–0.07)
Basophils Absolute: 0.1 10*3/uL (ref 0.0–0.1)
Basophils Relative: 0 %
Eosinophils Absolute: 0 10*3/uL (ref 0.0–0.5)
Eosinophils Relative: 0 %
HCT: 39.5 % (ref 36.0–46.0)
Hemoglobin: 13.3 g/dL (ref 12.0–15.0)
Immature Granulocytes: 0 %
Lymphocytes Relative: 22 %
Lymphs Abs: 4.1 10*3/uL — ABNORMAL HIGH (ref 0.7–4.0)
MCH: 30.2 pg (ref 26.0–34.0)
MCHC: 33.7 g/dL (ref 30.0–36.0)
MCV: 89.6 fL (ref 80.0–100.0)
Monocytes Absolute: 1 10*3/uL (ref 0.1–1.0)
Monocytes Relative: 6 %
Neutro Abs: 13.1 10*3/uL — ABNORMAL HIGH (ref 1.7–7.7)
Neutrophils Relative %: 72 %
Platelets: 350 10*3/uL (ref 150–400)
RBC: 4.41 MIL/uL (ref 3.87–5.11)
RDW: 13 % (ref 11.5–15.5)
WBC: 18.4 10*3/uL — ABNORMAL HIGH (ref 4.0–10.5)
nRBC: 0 % (ref 0.0–0.2)

## 2021-09-07 LAB — BASIC METABOLIC PANEL
Anion gap: 9 (ref 5–15)
BUN: 10 mg/dL (ref 6–20)
CO2: 25 mmol/L (ref 22–32)
Calcium: 9.8 mg/dL (ref 8.9–10.3)
Chloride: 103 mmol/L (ref 98–111)
Creatinine, Ser: 0.74 mg/dL (ref 0.44–1.00)
GFR, Estimated: >60 mL/min (ref >60)
Glucose, Bld: 96 mg/dL (ref 70–99)
Potassium: 3.8 mmol/L (ref 3.5–5.1)
Sodium: 137 mmol/L (ref 135–145)

## 2021-09-07 IMAGING — DX DG KNEE COMPLETE 4+V*R*
4 series · 4 of 4 positions shown · non-contrast
Comparison: None.

CLINICAL DATA: Lateral knee pain after falling down steps.

EXAM:
RIGHT KNEE - COMPLETE 4+ VIEW

[knee ap]
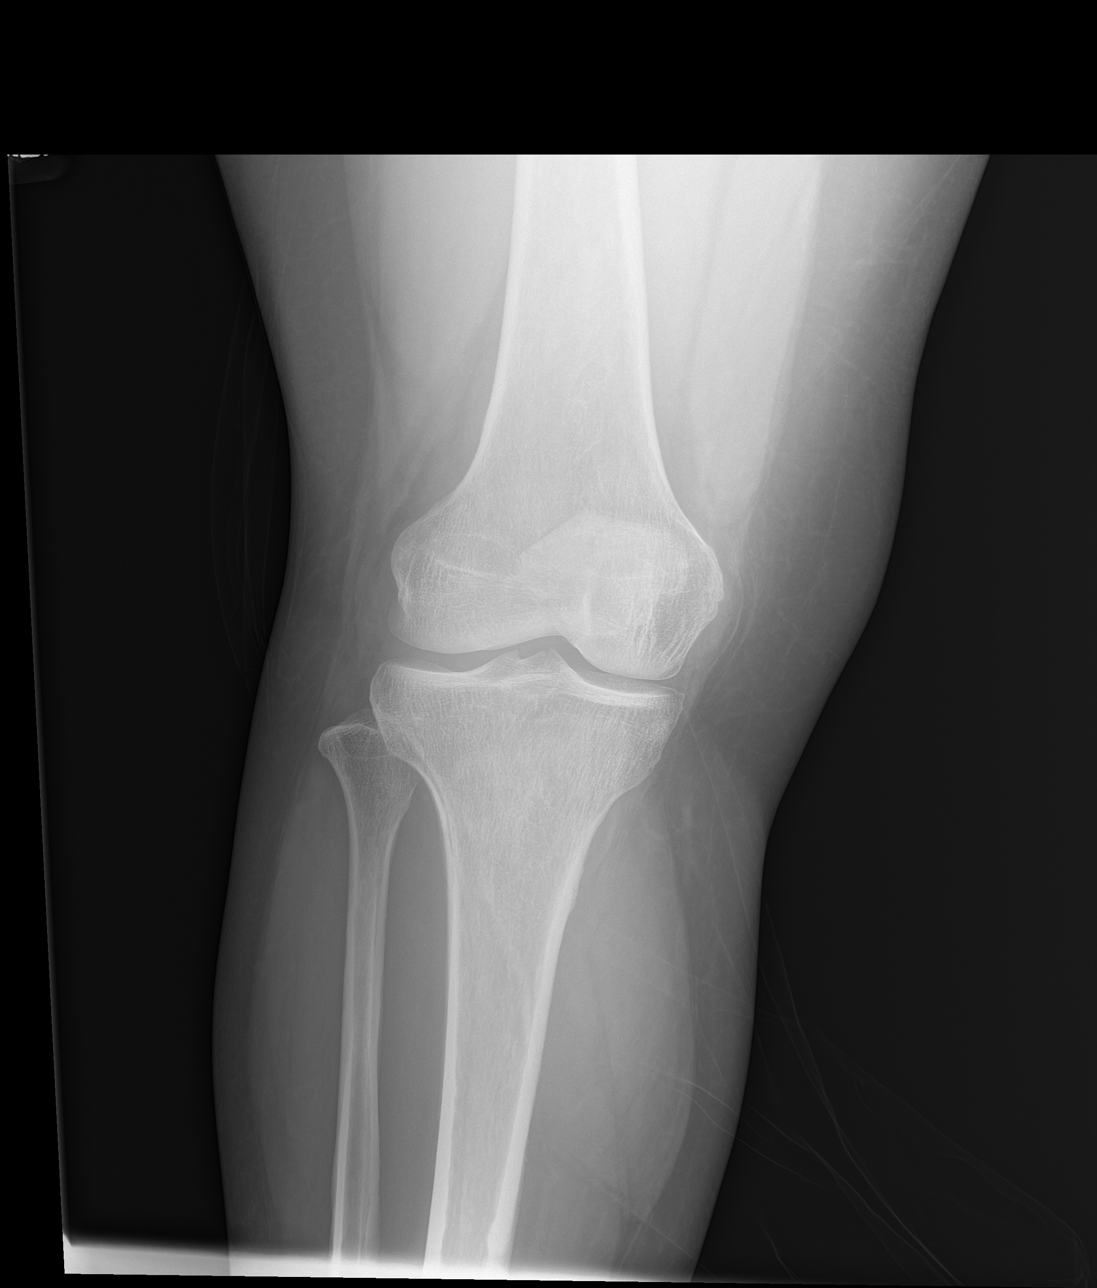

[knee obl (1 of 2)]
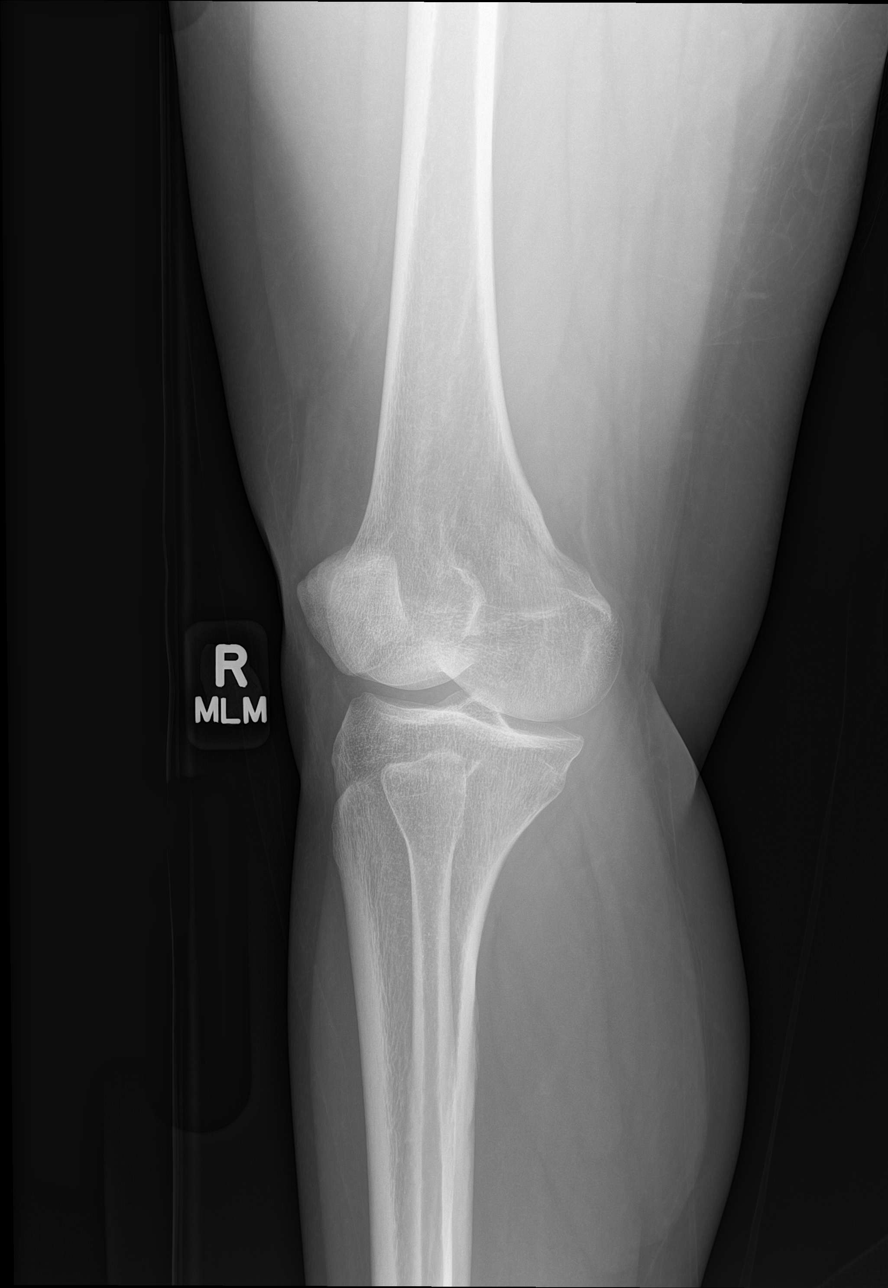

[knee obl (2 of 2)]
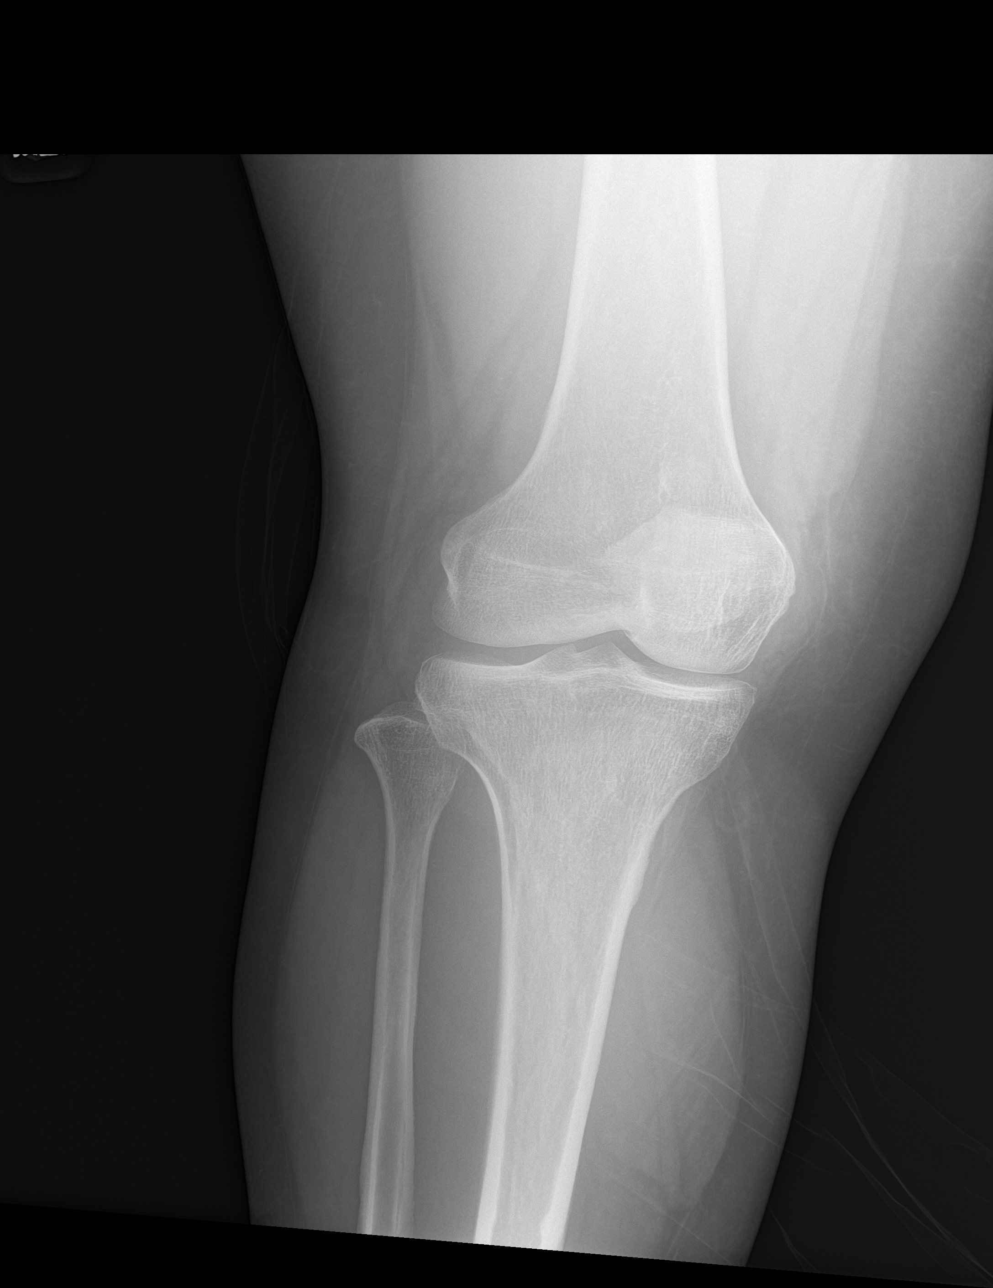

[knee lat]
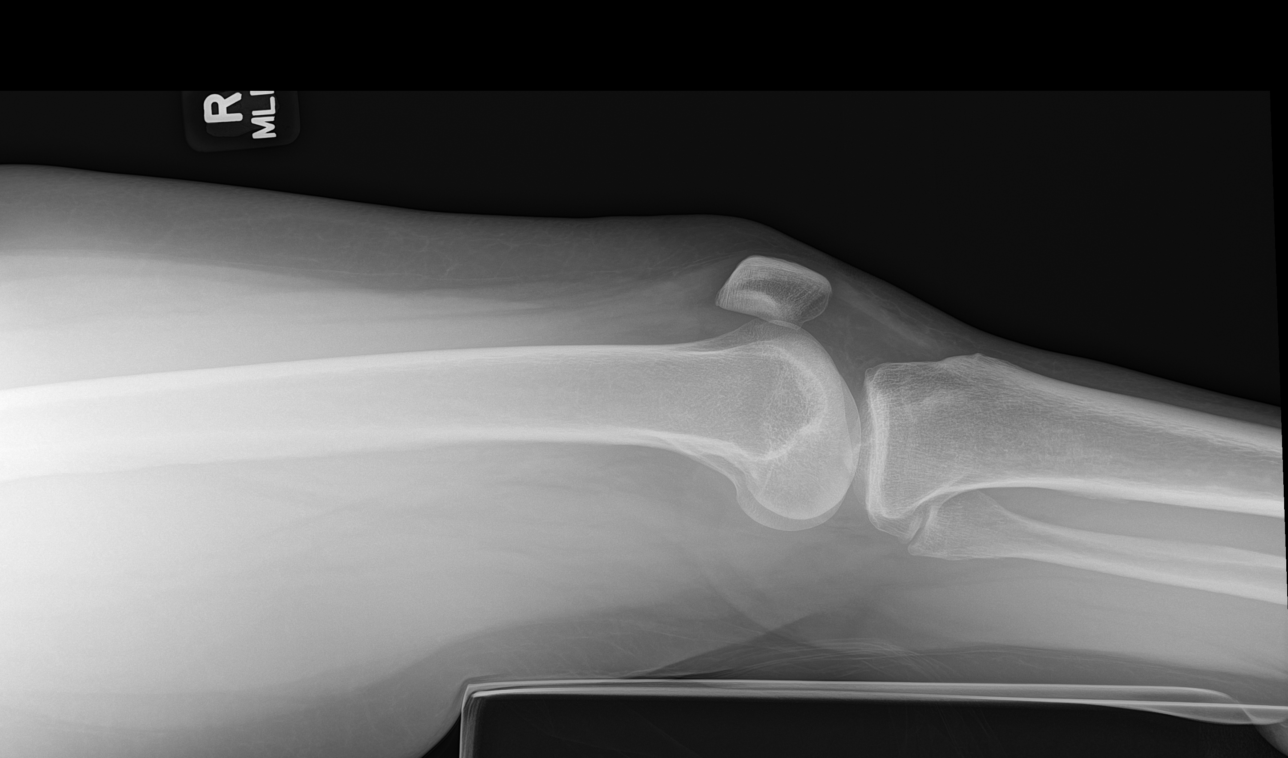

[4 of 4 positions shown; findings below may reference images not displayed]

FINDINGS: The mineralization and alignment are normal. There is no evidence of
acute fracture or dislocation. The joint spaces appear preserved. No
knee joint effusion, foreign body or soft tissue emphysema
identified.
IMPRESSION: No evidence of acute fracture or dislocation.

## 2021-09-07 IMAGING — DX DG HIP (WITH OR WITHOUT PELVIS) 2-3V*R*
4 series · 4 of 4 positions shown · non-contrast
Comparison: Pelvic CT [DATE].

CLINICAL DATA: Hip pain after falling down steps today.

EXAM:
DG HIP (WITH OR WITHOUT PELVIS) 2-3V RIGHT

[pelvis ap]
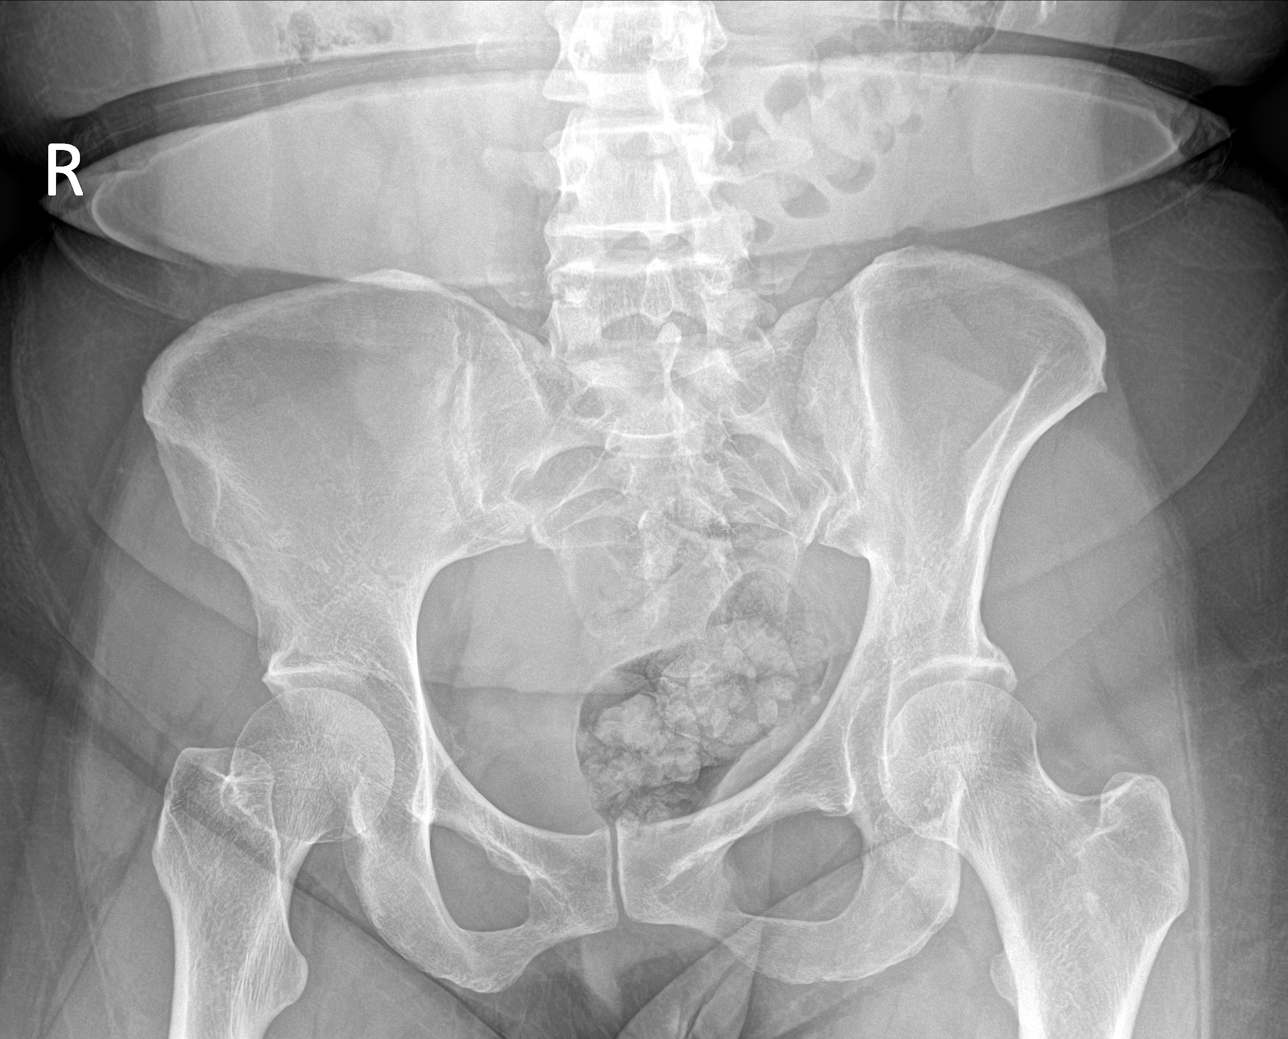

[hip ap]
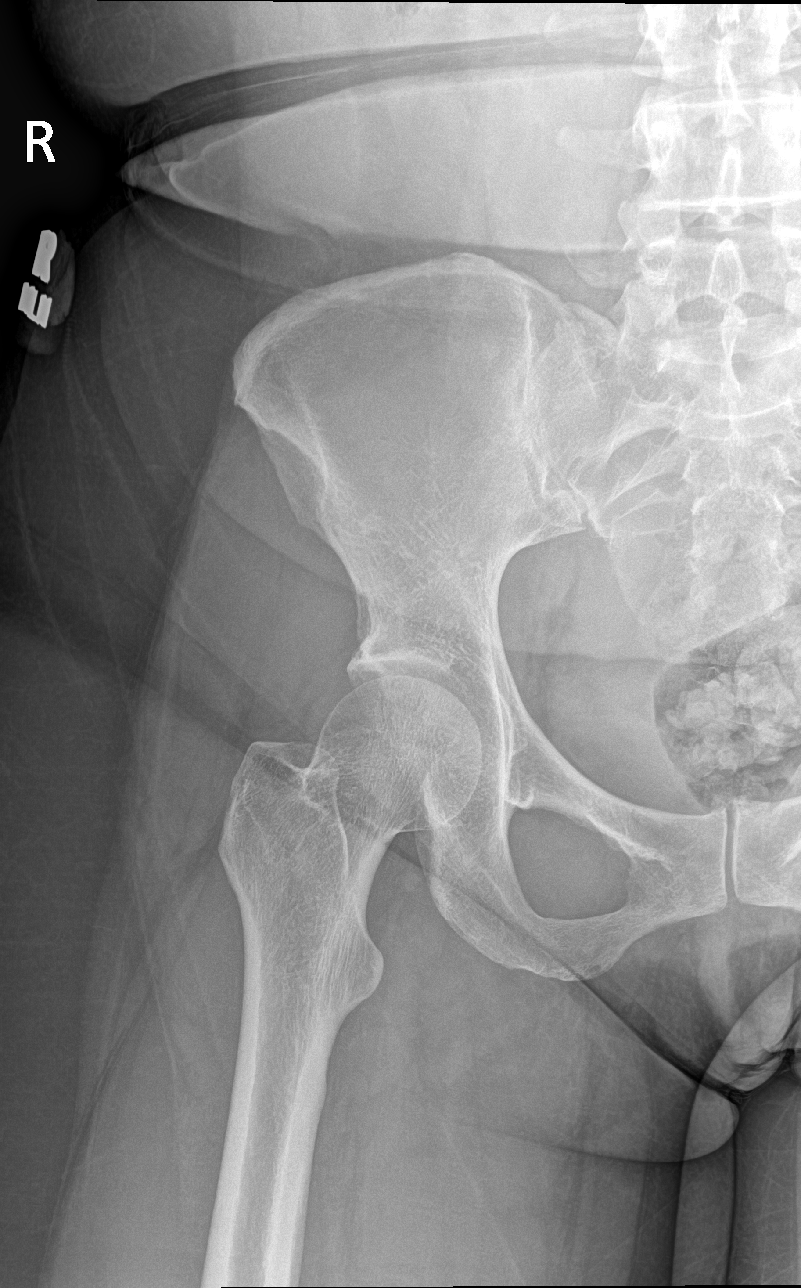

[hip lat (1 of 2)]
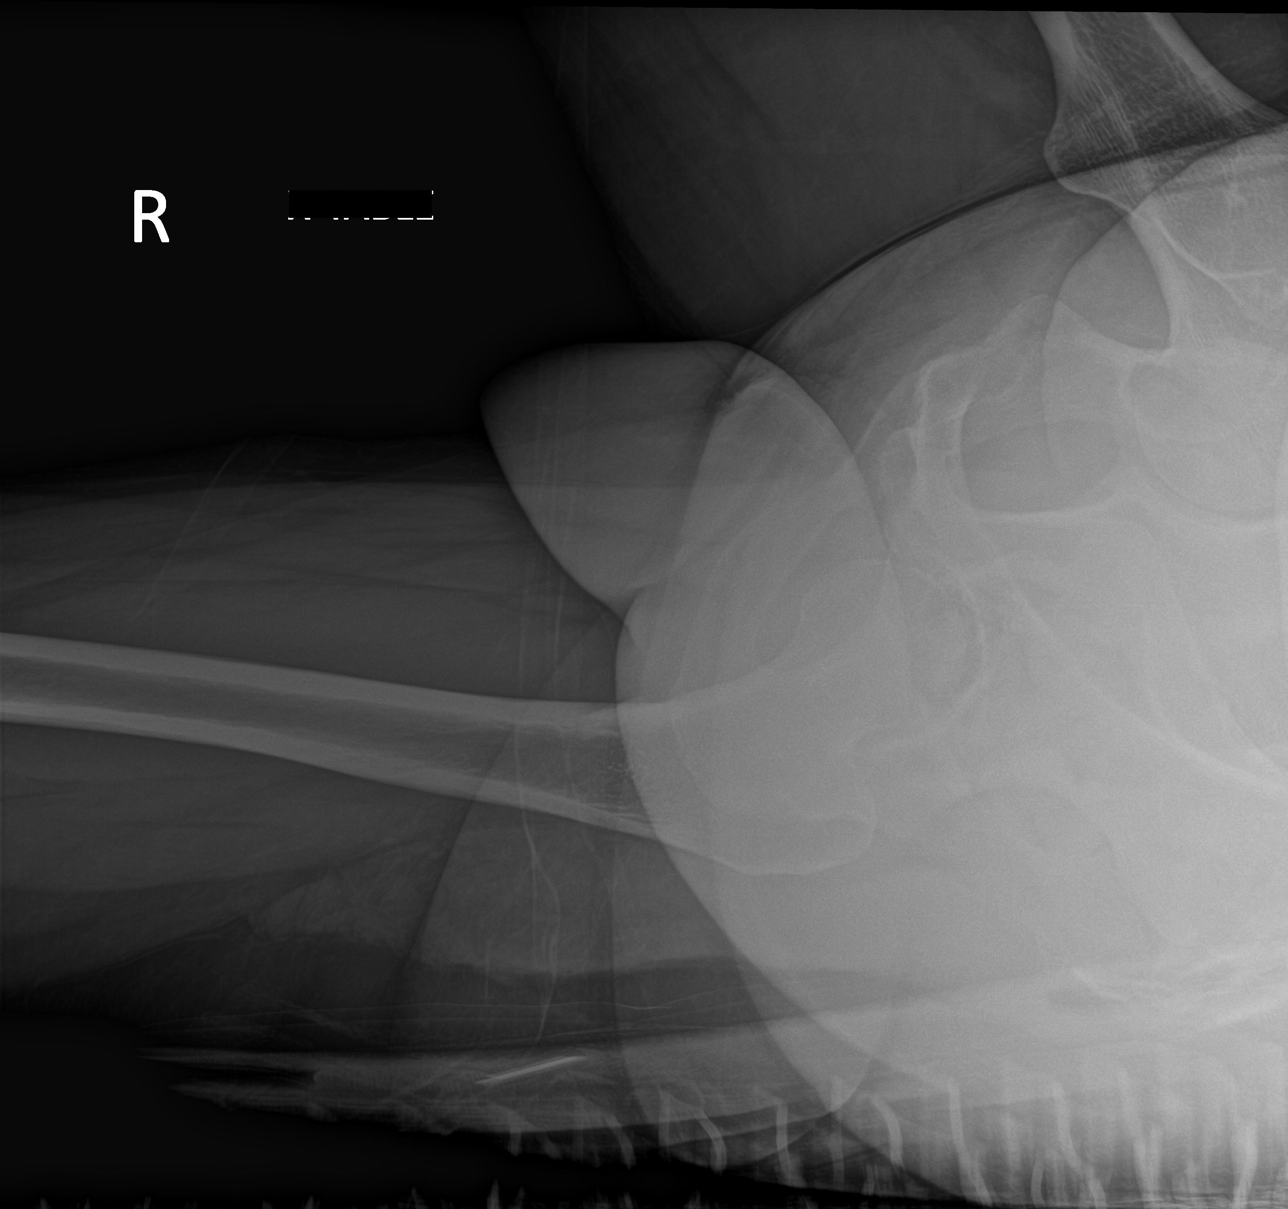

[hip lat (2 of 2)]
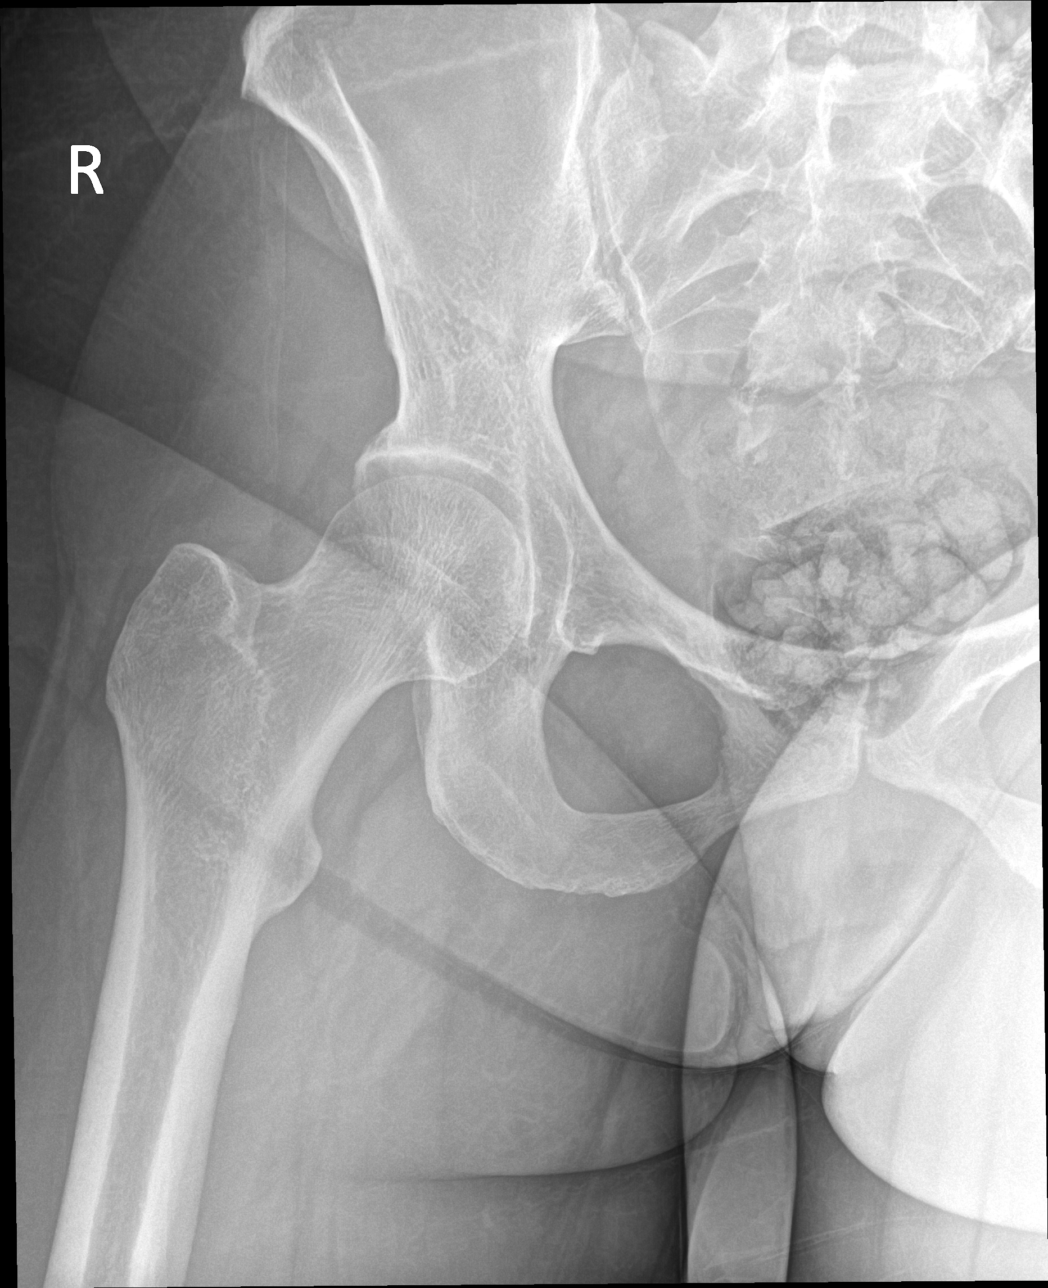

[4 of 4 positions shown; findings below may reference images not displayed]

FINDINGS: The AP view of the right hip was repeated due to rotation. The
mineralization and alignment are normal. There is no evidence of
acute fracture or dislocation. No evidence of femoral head avascular
necrosis. The hip and sacroiliac joint spaces appear maintained.
IMPRESSION: No evidence of acute fracture, dislocation or significant
arthropathy.

## 2021-09-07 MED ORDER — MORPHINE SULFATE (PF) 2 MG/ML IV SOLN
2.0000 mg | Freq: Once | INTRAVENOUS | Status: AC
  Administered 2021-09-07: 2 mg via INTRAVENOUS
  Filled 2021-09-07: qty 1

## 2021-09-07 MED ORDER — MORPHINE SULFATE (PF) 4 MG/ML IV SOLN
4.0000 mg | Freq: Once | INTRAVENOUS | Status: AC
  Administered 2021-09-07: 4 mg via INTRAVENOUS
  Filled 2021-09-07: qty 1

## 2021-09-07 MED ORDER — HYDROCODONE-ACETAMINOPHEN 5-325 MG PO TABS: 1.0000 | ORAL_TABLET | Freq: Four times a day (QID) | ORAL | 0 refills | Status: AC

## 2021-09-07 NOTE — Discharge Instructions (Addendum)
Imaging of your knee and hip were negative for any fracture dislocations.  I am concerned this is a ligamentous injury.  I would like for you to follow-up with orthopedics in the next week for possible MRI.  Please take 600 mg of ibuprofen every 6 hours for pain control.  Norco for breakthrough pain.  Please not operate heavy machinery with this medication.  Please return to the emergency department if you experience worsening pain, weakness/numbness to your lower extremities, or any other concerns you might have.
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
## 2021-09-07 NOTE — ED Triage Notes (Signed)
Pt fell down 3 steps hitting garage floor. Felt a pop in rt lateral knee. Unsure how she was able to get up. She was able to walk to class room and sat down, leg started to stiffen up. When she got to her car she could not bend her knee followed by hot flashes. Co worker took her to UC, then brought to ED.

## 2021-09-08 ENCOUNTER — Other Ambulatory Visit (HOSPITAL_BASED_OUTPATIENT_CLINIC_OR_DEPARTMENT_OTHER): Payer: Self-pay

## 2021-09-08 ENCOUNTER — Ambulatory Visit (HOSPITAL_BASED_OUTPATIENT_CLINIC_OR_DEPARTMENT_OTHER): Payer: 59 | Admitting: Orthopaedic Surgery

## 2021-09-08 DIAGNOSIS — M2391 Unspecified internal derangement of right knee: Secondary | ICD-10-CM

## 2021-09-08 MED ORDER — MELOXICAM 15 MG PO TABS
15.0000 mg | ORAL_TABLET | Freq: Every day | ORAL | 0 refills | Status: AC
  Filled 2021-09-08: qty 30, 30d supply, fill #0
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
## 2021-09-10 ENCOUNTER — Other Ambulatory Visit: Payer: Self-pay

## 2021-09-10 ENCOUNTER — Ambulatory Visit (HOSPITAL_BASED_OUTPATIENT_CLINIC_OR_DEPARTMENT_OTHER): Payer: 59 | Attending: Orthopaedic Surgery | Admitting: Physical Therapy

## 2021-09-10 ENCOUNTER — Encounter (HOSPITAL_BASED_OUTPATIENT_CLINIC_OR_DEPARTMENT_OTHER): Payer: Self-pay | Admitting: Physical Therapy

## 2021-09-10 DIAGNOSIS — M2391 Unspecified internal derangement of right knee: Secondary | ICD-10-CM | POA: Diagnosis not present

## 2021-09-10 DIAGNOSIS — M25561 Pain in right knee: Secondary | ICD-10-CM | POA: Diagnosis present

## 2021-09-10 DIAGNOSIS — M6281 Muscle weakness (generalized): Secondary | ICD-10-CM | POA: Diagnosis present

## 2021-09-10 DIAGNOSIS — R262 Difficulty in walking, not elsewhere classified: Secondary | ICD-10-CM | POA: Insufficient documentation
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
## 2021-09-18 ENCOUNTER — Ambulatory Visit (HOSPITAL_BASED_OUTPATIENT_CLINIC_OR_DEPARTMENT_OTHER): Payer: 59 | Admitting: Physical Therapy

## 2021-09-22 ENCOUNTER — Ambulatory Visit (HOSPITAL_BASED_OUTPATIENT_CLINIC_OR_DEPARTMENT_OTHER): Payer: 59 | Admitting: Physical Therapy
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
## 2021-09-24 ENCOUNTER — Ambulatory Visit (HOSPITAL_BASED_OUTPATIENT_CLINIC_OR_DEPARTMENT_OTHER): Payer: 59 | Admitting: Physical Therapy

## 2021-09-24 ENCOUNTER — Other Ambulatory Visit: Payer: Self-pay

## 2021-09-24 ENCOUNTER — Encounter (HOSPITAL_BASED_OUTPATIENT_CLINIC_OR_DEPARTMENT_OTHER): Payer: Self-pay | Admitting: Physical Therapy

## 2021-09-24 DIAGNOSIS — R262 Difficulty in walking, not elsewhere classified: Secondary | ICD-10-CM

## 2021-09-24 DIAGNOSIS — M6281 Muscle weakness (generalized): Secondary | ICD-10-CM

## 2021-09-24 DIAGNOSIS — M25561 Pain in right knee: Secondary | ICD-10-CM
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
## 2021-09-29 ENCOUNTER — Other Ambulatory Visit: Payer: Self-pay

## 2021-09-29 ENCOUNTER — Encounter (HOSPITAL_BASED_OUTPATIENT_CLINIC_OR_DEPARTMENT_OTHER): Payer: Self-pay | Admitting: Physical Therapy

## 2021-09-29 ENCOUNTER — Ambulatory Visit (HOSPITAL_BASED_OUTPATIENT_CLINIC_OR_DEPARTMENT_OTHER): Payer: 59 | Admitting: Physical Therapy

## 2021-09-29 DIAGNOSIS — M6281 Muscle weakness (generalized): Secondary | ICD-10-CM

## 2021-09-29 DIAGNOSIS — R262 Difficulty in walking, not elsewhere classified: Secondary | ICD-10-CM

## 2021-09-29 DIAGNOSIS — M25561 Pain in right knee: Secondary | ICD-10-CM
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
## 2021-10-06 ENCOUNTER — Ambulatory Visit (HOSPITAL_BASED_OUTPATIENT_CLINIC_OR_DEPARTMENT_OTHER): Payer: 59 | Admitting: Physical Therapy

## 2021-10-09 ENCOUNTER — Ambulatory Visit (HOSPITAL_BASED_OUTPATIENT_CLINIC_OR_DEPARTMENT_OTHER): Payer: 59 | Admitting: Orthopaedic Surgery

## 2021-10-09 ENCOUNTER — Other Ambulatory Visit: Payer: Self-pay

## 2021-10-09 DIAGNOSIS — M2391 Unspecified internal derangement of right knee: Secondary | ICD-10-CM | POA: Diagnosis not present

## 2021-10-09 NOTE — Addendum Note (Signed)
Addended by: Kerby Less A on: 10/09/2021 08:55 AM   Modules accepted: Orders
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
## 2021-10-29 ENCOUNTER — Ambulatory Visit (HOSPITAL_BASED_OUTPATIENT_CLINIC_OR_DEPARTMENT_OTHER): Payer: 59 | Admitting: Physical Therapy

## 2021-11-02 ENCOUNTER — Inpatient Hospital Stay: Admission: RE | Admit: 2021-11-02 | Payer: 59 | Source: Ambulatory Visit

## 2021-11-04 ENCOUNTER — Telehealth: Payer: Self-pay | Admitting: Orthopaedic Surgery

## 2021-11-04 NOTE — Telephone Encounter (Signed)
Attempted to call patient multiple times to reschedule MRI review appointment on 2/3. No answer and VM was full

## 2021-11-06 ENCOUNTER — Ambulatory Visit (HOSPITAL_BASED_OUTPATIENT_CLINIC_OR_DEPARTMENT_OTHER): Payer: 59 | Admitting: Orthopaedic Surgery

## 2021-11-15 ENCOUNTER — Ambulatory Visit
Admission: RE | Admit: 2021-11-15 | Discharge: 2021-11-15 | Disposition: A | Discharge status: Home / Self Care | Payer: 59 | Source: Ambulatory Visit | Attending: Orthopaedic Surgery | Admitting: Orthopaedic Surgery

## 2021-11-15 ENCOUNTER — Other Ambulatory Visit: Payer: Self-pay

## 2021-11-15 DIAGNOSIS — M2391 Unspecified internal derangement of right knee: Secondary | ICD-10-CM
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
## 2021-11-18 ENCOUNTER — Ambulatory Visit (HOSPITAL_BASED_OUTPATIENT_CLINIC_OR_DEPARTMENT_OTHER): Payer: 59 | Admitting: Orthopaedic Surgery

## 2021-11-19 ENCOUNTER — Telehealth (HOSPITAL_BASED_OUTPATIENT_CLINIC_OR_DEPARTMENT_OTHER): Payer: Self-pay | Admitting: Orthopaedic Surgery

## 2021-11-19 NOTE — Telephone Encounter (Signed)
MRI results

## 2022-05-12 ENCOUNTER — Encounter (INDEPENDENT_AMBULATORY_CARE_PROVIDER_SITE_OTHER): Payer: Self-pay

## 2022-08-17 ENCOUNTER — Encounter (INDEPENDENT_AMBULATORY_CARE_PROVIDER_SITE_OTHER): Payer: Self-pay

## 2023-11-17 ENCOUNTER — Encounter (INDEPENDENT_AMBULATORY_CARE_PROVIDER_SITE_OTHER): Payer: Self-pay

## 2023-12-06 ENCOUNTER — Encounter (INDEPENDENT_AMBULATORY_CARE_PROVIDER_SITE_OTHER): Payer: Self-pay

## 2024-03-28 ENCOUNTER — Institutional Professional Consult (permissible substitution): Admitting: Plastic Surgery

## 2024-04-10 ENCOUNTER — Ambulatory Visit (HOSPITAL_BASED_OUTPATIENT_CLINIC_OR_DEPARTMENT_OTHER): Attending: Physical Therapy | Admitting: Physical Therapy
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
## 2024-06-14 ENCOUNTER — Other Ambulatory Visit: Payer: Self-pay | Admitting: Obstetrics and Gynecology

## 2024-06-14 DIAGNOSIS — R2231 Localized swelling, mass and lump, right upper limb: Secondary | ICD-10-CM

## 2024-06-22 ENCOUNTER — Ambulatory Visit
Admission: RE | Admit: 2024-06-22 | Discharge: 2024-06-22 | Disposition: A | Discharge status: Home / Self Care | Source: Ambulatory Visit | Attending: Obstetrics and Gynecology | Admitting: Obstetrics and Gynecology

## 2024-06-22 DIAGNOSIS — R2231 Localized swelling, mass and lump, right upper limb: Secondary | ICD-10-CM

## 2024-09-10 ENCOUNTER — Other Ambulatory Visit: Payer: Self-pay | Admitting: Obstetrics and Gynecology

## 2024-09-10 DIAGNOSIS — Z1231 Encounter for screening mammogram for malignant neoplasm of breast: Secondary | ICD-10-CM

## 2024-09-14 ENCOUNTER — Emergency Department (HOSPITAL_BASED_OUTPATIENT_CLINIC_OR_DEPARTMENT_OTHER)
Admission: EM | Admit: 2024-09-14 | Discharge: 2024-09-14 | Disposition: A | Discharge status: Home / Self Care | Attending: Emergency Medicine | Admitting: Emergency Medicine

## 2024-09-14 ENCOUNTER — Encounter (HOSPITAL_BASED_OUTPATIENT_CLINIC_OR_DEPARTMENT_OTHER): Payer: Self-pay

## 2024-09-14 ENCOUNTER — Emergency Department (HOSPITAL_BASED_OUTPATIENT_CLINIC_OR_DEPARTMENT_OTHER)

## 2024-09-14 ENCOUNTER — Other Ambulatory Visit: Payer: Self-pay

## 2024-09-14 ENCOUNTER — Emergency Department (HOSPITAL_COMMUNITY)
Admission: EM | Admit: 2024-09-14 | Discharge: 2024-09-14 | Discharge status: Against Medical Advice | Attending: Emergency Medicine | Admitting: Emergency Medicine

## 2024-09-14 ENCOUNTER — Encounter (HOSPITAL_COMMUNITY): Payer: Self-pay

## 2024-09-14 DIAGNOSIS — R519 Headache, unspecified: Secondary | ICD-10-CM | POA: Insufficient documentation

## 2024-09-14 DIAGNOSIS — Z5321 Procedure and treatment not carried out due to patient leaving prior to being seen by health care provider: Secondary | ICD-10-CM | POA: Diagnosis not present

## 2024-09-14 DIAGNOSIS — R110 Nausea: Secondary | ICD-10-CM | POA: Insufficient documentation

## 2024-09-14 DIAGNOSIS — M542 Cervicalgia: Secondary | ICD-10-CM | POA: Insufficient documentation

## 2024-09-14 DIAGNOSIS — R202 Paresthesia of skin: Secondary | ICD-10-CM | POA: Diagnosis not present

## 2024-09-14 MED ORDER — KETOROLAC TROMETHAMINE 60 MG/2ML IM SOLN
60.0000 mg | Freq: Once | INTRAMUSCULAR | Status: AC
  Administered 2024-09-14: 60 mg via INTRAMUSCULAR
  Filled 2024-09-14: qty 2

## 2024-09-14 MED ORDER — PREDNISONE 20 MG PO TABS
40.0000 mg | ORAL_TABLET | Freq: Once | ORAL | Status: AC
  Administered 2024-09-14: 40 mg via ORAL
  Filled 2024-09-14: qty 2

## 2024-09-14 NOTE — ED Provider Triage Note (Signed)
 Emergency Medicine Provider Triage Evaluation Note  [name] [name] , a 43 y.o. female  was evaluated in triage.  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  This is more gradual in onset.  She does not get headaches very often, she has no other neurologic complaints including numbness weakness discoordination speech abnormalities vision abnormalities or gait abnormalities.  She does note that last night she use some IcyHot on the back of her right trapezius because she was having some discomfort in that region, seems to be better today  Review of Systems  Positive: Headache, nausea, tingling Negative: Chest pain shortness of breath cough fever chills stiff neck  Physical Exam  BP (!) 165/101 (BP Location: Right Arm)   Pulse 99   Temp 98.1 F (36.7 C) (Oral)   Resp 18   Ht 1.549 m (5' 1)   Wt 81.6 kg   SpO2 100%   BMI 34.01 kg/m  Gen:   Awake, no distress tender over the left temporal region Resp:  Normal effort clear lung sounds, clear heart sounds, normal pulses MSK:   Moves extremities without difficulty no edema, normal gait Other:  Speech is clear, cranial nerves III through XII are intact, memory is intact, strength is normal in all 4 extremities including grips and strength at the bilateral thighs, knees and ankles to extention and flexion, sensation is intact to light touch and pinprick in all 4 extremities. Coordination as tested by finger-nose-finger is normal, no limb ataxia. Normal gait,   Medical Decision Making  Medically screening exam initiated at 8:51 AM.  Appropriate orders placed.  Tomi E Porreca was informed that  the remainder of the evaluation will be completed by another provider, this initial triage assessment does not replace that evaluation, and the importance of remaining in the ED until their evaluation is complete.  Will be given Toradol , prednisone, this seems to be very reproducible over the left temporal region although she is not in the age group of someone who would have temporal arteritis.  There is no visual changes, she is slightly hypertensive, otherwise appears hemodynamically stable and I do not think she needs a CT scan of the head at this time   Cleotilde Rogue, MD 09/14/24 [phone]

## 2024-09-14 NOTE — Discharge Instructions (Signed)
 Please monitor blood pressures at home and follow-up with PCP in 1 week for reevaluation.  Take Tylenol  or ibuprofen  every 6 hours as needed for pain.  return to ED if any symptoms worsen including severe uncontrolled headache, uncontrolled nausea/vomiting, numbness/tingling/weakness, vision loss, syncopal episode.

## 2024-09-14 NOTE — ED Provider Notes (Signed)
 [place] EMERGENCY DEPARTMENT AT [facility] Provider Note   CSN: [id number] Arrival date & time: 09/14/24  [id number]     Patient presents with: Headache   GLORIANNE PROCTOR is a 43 y.o. female.  Patient is a 43 year old female who presents to the ED for increasing headache that began when she woke up this morning.  Patient notes she felt a weird sensation on the left side of her head around 5 PM and by the time she woke up around 7 AM, she was having a severe left-sided headache.  Also notes was having some right-sided neck pain.  States she occasionally gets headaches around her menstrual cycle but does not normally get headaches regularly.  States this is worse in her normal headache.  She notes she has an IUD in place and is unsure if she could be on her cycle but is having similar symptoms.  No acute vaginal bleeding at this time.  States she had increasing headache at work and her coworker took her to the [facility] emergency department this morning.  She was seen in triage there and given Toradol  and prednisone.  She notes her headache has improved throughout the day today.  Notes a mild headache of 1 out of 10 at this time, was 10 out of 10 this morning.  Notes some nausea this morning but that has improved as well.  Denies dizziness, vision changes, syncope, chest pain, shortness of breath.  No further complaints.    Headache Associated symptoms: no dizziness, no fever and no weakness        Prior to Admission medications  Medication Sig Start Date End Date Taking? Authorizing Provider  famotidine  (PEPCID ) 20 MG tablet Take 1 tablet (20 mg total) by mouth 2 (two) times daily. 02/25/21 05/14/21  Cottie Donnice PARAS, MD  ferrous sulfate  325 (65 FE) MG EC tablet Take 1 tablet (325 mg total) by mouth daily with breakfast. 08/05/20   Opalski, Barnie, DO  HYDROcodone -acetaminophen  (NORCO/VICODIN) 5-325 MG tablet Take 1 tablet by mouth every 6 (six) hours. 09/07/21   Theotis Peers M,  PA-C  meloxicam  (MOBIC ) 15 MG tablet Take 1 tablet (15 mg total) by mouth daily. 09/08/21   Genelle Standing, MD  norethindrone -ethinyl estradiol (JUNEL FE,GILDESS FE,LOESTRIN FE) 1-20 MG-MCG tablet Take 1 tablet by mouth daily.    [provider]  VITAMIN D  PO Take by mouth.    [provider]  Vitamin D , Ergocalciferol , (DRISDOL ) 1.25 MG (50000 UNIT) CAPS capsule Take 1 capsule (50,000 Units total) by mouth every 7 (seven) days. 08/25/20   Midge Barnie, DO    Allergies: Benadryl  [diphenhydramine  hcl], Calamine, and Bactrim [sulfamethoxazole-trimethoprim]    Review of Systems  Constitutional:  Negative for fever.  Respiratory:  Negative for shortness of breath.   Cardiovascular:  Negative for chest pain.  Neurological:  Positive for headaches. Negative for dizziness, syncope and weakness.  All other systems reviewed and are negative.   Updated Vital Signs BP (!) 154/88 (BP Location: Right Arm)   Pulse 71   Temp 98 F (36.7 C) (Oral)   Resp 18   Ht 5' 1 (1.549 m)   Wt 81.6 kg   SpO2 100%   BMI 34.01 kg/m   Physical Exam Constitutional:      Appearance: She is well-developed.  HENT:     Head: Normocephalic and atraumatic.  Eyes:     Extraocular Movements: Extraocular movements intact.     Pupils: Pupils are equal, round, and reactive  to light.  Cardiovascular:     Rate and Rhythm: Normal rate.  Pulmonary:     Effort: Pulmonary effort is normal.  Musculoskeletal:     Cervical back: Normal range of motion and neck supple. No rigidity.  Neurological:     Mental Status: She is alert and oriented to person, place, and time.     GCS: GCS eye subscore is 4. GCS verbal subscore is 5. GCS motor subscore is 6.     Cranial Nerves: No cranial nerve deficit.  Psychiatric:        Mood and Affect: Mood normal.        Behavior: Behavior normal.     (all labs ordered are listed, but only abnormal results are displayed) Labs Reviewed - No data to  display  EKG: None  Radiology: CT Head Wo Contrast Result Date: 09/14/2024 EXAM: CT HEAD WITHOUT 09/14/2024 06:37:25 PM TECHNIQUE: CT of the head was performed without the administration of intravenous contrast. Automated exposure control, iterative reconstruction, and/or weight based adjustment of the mA/kV was utilized to reduce the radiation dose to as low as reasonably achievable. COMPARISON: None available. CLINICAL HISTORY: Headache, increasing frequency or severity FINDINGS: BRAIN AND VENTRICLES: There is no evidence of an acute infarct, intracranial hemorrhage, mass, midline shift, hydrocephalus, or extra-axial fluid collection. Cerebral volume is normal. A partially empty sella is noted. ORBITS: No acute abnormality. SINUSES AND MASTOIDS: Prominent polypoid mucosal thickening/mucous retention cysts in the left maxillary sinus. Clear mastoid air cells. SOFT TISSUES AND SKULL: Nonspecific 1 cm cutaneous and subcutaneous soft tissue nodule in the left frontal scalp. No acute skull fracture. IMPRESSION: 1. No acute intracranial abnormality. 2. Partially empty sella, often reflecting normal anatomic variation, though can also be seen with idiopathic intracranial hypertension. Electronically signed by: Dasie Hamburg MD 09/14/2024 07:09 PM EST RP Workstation: [id number]      Medications Ordered in the ED - No data to display                                 Medical Decision Making Patient is a 43 year old female with no significant medical history who presents to the ED for left-sided headache that began when she woke up this morning.  Please see detailed HPI above.  On exam patient is alert and well-appearing.  Physical exam as noted above.  No acute neurologic deficits noted.  Patient to go to [facility] emergency room this morning and was triaged but was not evaluated in the back.  She was given Toradol  and prednisone and notes headache has significantly improved throughout the day today.  CT of  the head reviewed that was negative for acute intracranial abnormality.  There is a partially empty sella that is not often a normal anatomic variation.  This can occasionally represent idiopathic intracranial hypertension.  Differential includes viral illness, migraine headache, tension headache, intracranial abnormality, TIA, intracranial mass, temporal arteritis, hypertensive urgency/emergency.  Patient's blood pressure noted to be 154/88 while in ED.  I do have low suspicion for idiopathic intracranial hypertension or hypertensive urgency/emergency as patient has no known history of hypertension and blood pressure was 126/84 while in ED this morning.  Suspect patient's symptoms are likely secondary to menstrual related headache versus migraine versus tension headache.  She is well-appearing with no deficits this evening.  Stable for discharge home.  Patient advised to monitor blood pressures and follow-up with PCP in 1 week for reevaluation and  management.  Also advised to follow-up with for further evaluation of any continued headaches.  She understands plan is agreeable.  Return precautions provided.      Amount and/or Complexity of Data Reviewed Radiology: ordered.       Final diagnoses:  Acute intractable headache, unspecified headache type    ED Discharge Orders     None          Neysa Thersia RAMAN, PA-C 09/14/24 1943    Ruthe Cornet, DO 09/14/24 1948

## 2024-09-14 NOTE — ED Triage Notes (Signed)
 Pt reports L sided headache since this AM w/ nausea. Pt went to [facility] and left. Pt reports possible blurred vision when she first woke up but it went away. Pt given Toradol  and Prednisone while at [facility] and reports HA has improved.

## 2024-09-14 NOTE — ED Triage Notes (Signed)
 Pt c.o sudden onset left sided headache at 7am with nausea.

## 2024-09-18 ENCOUNTER — Ambulatory Visit
Admission: RE | Admit: 2024-09-18 | Discharge: 2024-09-18 | Disposition: A | Discharge status: Home / Self Care | Source: Ambulatory Visit | Attending: Obstetrics and Gynecology | Admitting: Obstetrics and Gynecology

## 2024-09-18 DIAGNOSIS — Z1231 Encounter for screening mammogram for malignant neoplasm of breast: Secondary | ICD-10-CM

## 2024-10-11 ENCOUNTER — Ambulatory Visit
Admission: RE | Admit: 2024-10-11 | Discharge: 2024-10-11 | Disposition: A | Discharge status: Home / Self Care | Source: Ambulatory Visit | Attending: Obstetrics and Gynecology | Admitting: Obstetrics and Gynecology

## 2024-10-11 ENCOUNTER — Ambulatory Visit
